# Patient Record
Sex: Female | Born: 1975 | Race: White | Hispanic: No | Marital: Married | Smoking: Never smoker
Health system: Southern US, Academic
[De-identification: ages and names within clinical notes are randomized; demographics above are authoritative.]

## PROBLEM LIST (undated history)

## (undated) DIAGNOSIS — Z9889 Other specified postprocedural states: Secondary | ICD-10-CM

## (undated) DIAGNOSIS — E079 Disorder of thyroid, unspecified: Secondary | ICD-10-CM

## (undated) DIAGNOSIS — E039 Hypothyroidism, unspecified: Secondary | ICD-10-CM

## (undated) DIAGNOSIS — R112 Nausea with vomiting, unspecified: Secondary | ICD-10-CM

## (undated) DIAGNOSIS — Z973 Presence of spectacles and contact lenses: Secondary | ICD-10-CM

## (undated) DIAGNOSIS — K1370 Unspecified lesions of oral mucosa: Secondary | ICD-10-CM

## (undated) HISTORY — PX: EYE SURGERY: SHX253

---
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
## 1998-11-21 HISTORY — PX: HX OTHER: 2100001105

## 1998-11-21 HISTORY — PX: HX TUBAL LIGATION: SHX77

## 2018-02-19 HISTORY — PX: MOUTH SURGERY: SHX715

## 2018-09-24 NOTE — H&P (Signed)
[facility]  Oral and Maxillofacial Surgery  ADMISSION H&P    Lindsey Bass, Lindsey Bass 42 y.o. female  Date of Admission:  (Not on file)  Date of Birth:  [birth year]    CHIEF COMPLAINT:  Hard Palate lesion     HISTORY OF PRESENT ILLNESS   Lindsey Bass is a 42 y.o. female with past medical history of Hypothyroidism who presents to [place] due to lesion on the hard palate. The patient states she was seen originally by Dr. 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 Dr. Grandville Silos was then sent up to [facility] for definitive treatment. The patient stated the area originally was two small lesions that became one area. Denies CP, SOB, F/C/N/V.     PAST MEDICAL HISTORY  No past medical history on file.  Past Medical History was reviewed       MEDICATIONS   Cannot display prior to admission medications because the patient has not been admitted in this contact.          ALLERGIES  Allergies not on file    SURGICAL HISTORY  Past Surgical History was reviewed         FAMILY HISTORY  Family Medical History:     None              SOCIAL HISTORY  Social History     Socioeconomic History   . Marital status: Not on file     Spouse name: Not on file   . Number of children: Not on file   . Years of education: Not on file   . Highest education level: Not on file   Occupational History   . Not on file   Social Needs   . Financial resource strain: Not on file   . Food insecurity:     Worry: Not on file     Inability: Not on file   . Transportation needs:     Medical: Not on file     Non-medical: Not on file   Tobacco Use   . Smoking status: Not on file   Substance and Sexual Activity   . Alcohol use: Not on file   . Drug use: Not on file   . Sexual activity: Not on file   Lifestyle   . Physical activity:     Days per week: Not on file     Minutes per session: Not on file   . Stress: Not on file   Relationships   . Social connections:     Talks on phone: Not on file     Gets  together: Not on file     Attends religious service: Not on file     Active member of club or organization: Not on file     Attends meetings of clubs or organizations: Not on file     Relationship status: Not on file   . Intimate partner violence:     Fear of current or ex partner: Not on file     Emotionally abused: Not on file     Physically abused: Not on file     Forced sexual activity: Not on file   Other Topics Concern   . Not on file   Social History Narrative   . Not on file       REVIEW OF SYSTEMS  Reviewed/ Summarized records and/or obtained History from patient  Other than ROS in the HPI, all  other systems were negative.    PHYSICAL EXAM     Consitutional: Pt NAD. Pleasant.  ENT: PERRL, EOMI.Marland KitchenNo malocclusion, FOM soft, Tongue NROM, CN V, VII intact, No skeletal mobility, Midline uvula, No swelling, Dentition stable, small area intraorally seemed to be scarred over  Cardio: RRR. No murmurs/gallops/rubs.   Resp: CTA. No wheezes/rales/rhonchi. Normal resp effort.  Abd: BS active. Abd soft/NTTP.  GU: Deferred  MSK: No gait disturbances/weakness.   Skin: No rashes/ulcers.  Neuro: a/o x3. No focal deficits. CN 2-12 Grossly intact  Psych: Normal mood/affect    LABS  No results found for this or any previous visit (from the past 24 hour(s)).    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 I saw and examined the patient and agree with the resident's findings and plan as written except as noted     Parke Simmers, DDS

## 2018-10-02 ENCOUNTER — Encounter (HOSPITAL_COMMUNITY): Payer: Self-pay

## 2018-10-09 ENCOUNTER — Ambulatory Visit
Admission: RE | Admit: 2018-10-09 | Discharge: 2018-10-09 | Disposition: A | Discharge status: Home / Self Care | Payer: 59 | Source: Ambulatory Visit

## 2018-10-09 ENCOUNTER — Encounter (HOSPITAL_COMMUNITY): Payer: Self-pay

## 2018-10-09 HISTORY — DX: Other specified postprocedural states: Z98.890

## 2018-10-09 HISTORY — DX: Disorder of thyroid, unspecified: E07.9

## 2018-10-09 HISTORY — DX: Hypothyroidism, unspecified: E03.9

## 2018-10-09 HISTORY — DX: Nausea with vomiting, unspecified: R11.2

## 2018-10-09 HISTORY — DX: Presence of spectacles and contact lenses: Z97.3

## 2018-10-09 HISTORY — DX: Unspecified lesions of oral mucosa: K13.70

## 2018-10-09 LAB — POC BLOOD GLUCOSE (RESULTS): GLUCOSE, POC: 92 mg/dL (ref 70–105)

## 2018-10-10 ENCOUNTER — Inpatient Hospital Stay
Admission: RE | Admit: 2018-10-10 | Discharge: 2018-10-10 | Disposition: A | Discharge status: Home / Self Care | Payer: 59 | Source: Ambulatory Visit | Attending: Oral and Maxillofacial Surgery | Admitting: Oral and Maxillofacial Surgery

## 2018-10-10 ENCOUNTER — Encounter (HOSPITAL_COMMUNITY): Payer: Self-pay

## 2018-10-10 ENCOUNTER — Other Ambulatory Visit (HOSPITAL_COMMUNITY): Payer: Self-pay | Admitting: Oral and Maxillofacial Surgery

## 2018-10-10 ENCOUNTER — Ambulatory Visit (HOSPITAL_COMMUNITY): Payer: 59 | Admitting: Student in an Organized Health Care Education/Training Program

## 2018-10-10 ENCOUNTER — Ambulatory Visit (HOSPITAL_COMMUNITY): Payer: 59 | Admitting: Oral and Maxillofacial Surgery

## 2018-10-10 ENCOUNTER — Encounter (HOSPITAL_COMMUNITY)
Admission: RE | Disposition: A | Discharge status: Home / Self Care | Payer: Self-pay | Source: Ambulatory Visit | Attending: Oral and Maxillofacial Surgery

## 2018-10-10 ENCOUNTER — Ambulatory Visit (HOSPITAL_BASED_OUTPATIENT_CLINIC_OR_DEPARTMENT_OTHER): Payer: 59 | Admitting: Student in an Organized Health Care Education/Training Program

## 2018-10-10 DIAGNOSIS — E039 Hypothyroidism, unspecified: Secondary | ICD-10-CM | POA: Insufficient documentation

## 2018-10-10 DIAGNOSIS — C050 Malignant neoplasm of hard palate: Secondary | ICD-10-CM

## 2018-10-10 DIAGNOSIS — Z79890 Hormone replacement therapy (postmenopausal): Secondary | ICD-10-CM | POA: Insufficient documentation

## 2018-10-10 DIAGNOSIS — D1030 Benign neoplasm of unspecified part of mouth: Secondary | ICD-10-CM

## 2018-10-10 SURGERY — EXCISION LESION ORAL
Anesthesia: General | Site: Mouth | Laterality: Bilateral | Wound class: Clean Contaminated Wounds-The respiratory, GI, Genital, or urinary

## 2018-10-10 MED ORDER — SODIUM CHLORIDE 0.9 % IRRIGATION SOLUTION
1000.0000 mL | Status: DC | PRN
Start: 2018-10-10 — End: 2018-10-10
  Administered 2018-10-10: 13:00:00 1000 mL

## 2018-10-10 MED ORDER — BUPIVACAINE-EPINEPHRINE (PF) 0.5 %-1:200,000 INJECTION SOLUTION
30.0000 mL | Freq: Once | INTRAMUSCULAR | Status: DC | PRN
Start: 2018-10-10 — End: 2018-10-10
  Administered 2018-10-10: 1 mL via INTRAMUSCULAR

## 2018-10-10 MED ORDER — KETOROLAC 30 MG/ML (1 ML) INJECTION SOLUTION
Freq: Once | INTRAMUSCULAR | Status: DC | PRN
Start: 2018-10-10 — End: 2018-10-10
  Administered 2018-10-10 (×2): 30 mg via INTRAVENOUS

## 2018-10-10 MED ORDER — HYDROCODONE 5 MG-ACETAMINOPHEN 325 MG TABLET
1.00 | ORAL_TABLET | ORAL | 0 refills | Status: DC | PRN
Start: 2018-10-10 — End: 2021-06-29

## 2018-10-10 MED ORDER — IBUPROFEN 600 MG TABLET: 600 mg | Tab | Freq: Four times a day (QID) | ORAL | 0 refills | 0 days | Status: AC | PRN

## 2018-10-10 MED ORDER — LIDOCAINE 1 %-EPINEPHRINE 1:100,000 INJECTION SOLUTION
15.0000 mL | Freq: Once | INTRAMUSCULAR | Status: DC | PRN
Start: 2018-10-10 — End: 2018-10-10
  Administered 2018-10-10: 3 mL via INTRAMUSCULAR

## 2018-10-10 MED ORDER — PROPOFOL 10 MG/ML IV BOLUS
INJECTION | Freq: Once | INTRAVENOUS | Status: DC | PRN
Start: 2018-10-10 — End: 2018-10-10
  Administered 2018-10-10: 140 mg via INTRAVENOUS

## 2018-10-10 MED ORDER — GLYCOPYRROLATE 0.2 MG/ML INJECTION SOLUTION
Freq: Once | INTRAMUSCULAR | Status: DC | PRN
Start: 2018-10-10 — End: 2018-10-10
  Administered 2018-10-10: 0.6 mg via INTRAVENOUS

## 2018-10-10 MED ORDER — SCOPOLAMINE 1 MG OVER 3 DAYS TRANSDERMAL PATCH
MEDICATED_PATCH | Freq: Once | TRANSDERMAL | Status: DC | PRN
Start: 2018-10-10 — End: 2018-10-10
  Administered 2018-10-10: 1 via TRANSDERMAL

## 2018-10-10 MED ORDER — SODIUM CHLORIDE 0.9 % (FLUSH) INJECTION SYRINGE
2.0000 mL | INJECTION | INTRAMUSCULAR | Status: DC | PRN
Start: 2018-10-10 — End: 2018-10-10

## 2018-10-10 MED ORDER — DEXMEDETOMIDINE 4 MCG/ML IV DILUTION
Freq: Once | INTRAMUSCULAR | Status: DC | PRN
Start: 2018-10-10 — End: 2018-10-10
  Administered 2018-10-10: 13:00:00 4 ug via INTRAVENOUS
  Administered 2018-10-10 (×3): 8 ug via INTRAVENOUS

## 2018-10-10 MED ORDER — APREPITANT 40 MG CAPSULE
ORAL_CAPSULE | Freq: Once | ORAL | Status: DC | PRN
Start: 2018-10-10 — End: 2018-10-10
  Administered 2018-10-10: 40 mg via ORAL

## 2018-10-10 MED ORDER — SODIUM CHLORIDE 0.9 % (FLUSH) INJECTION SYRINGE
2.0000 mL | INJECTION | Freq: Three times a day (TID) | INTRAMUSCULAR | Status: DC
Start: 2018-10-10 — End: 2018-10-10

## 2018-10-10 MED ORDER — ONDANSETRON HCL (PF) 4 MG/2 ML INJECTION SOLUTION
Freq: Once | INTRAMUSCULAR | Status: DC | PRN
Start: 2018-10-10 — End: 2018-10-10
  Administered 2018-10-10: 4 mg via INTRAVENOUS

## 2018-10-10 MED ORDER — DEXAMETHASONE SODIUM PHOSPHATE 4 MG/ML INJECTION SOLUTION
Freq: Once | INTRAMUSCULAR | Status: DC | PRN
Start: 2018-10-10 — End: 2018-10-10
  Administered 2018-10-10: 4 mg via INTRAVENOUS

## 2018-10-10 MED ORDER — MIDAZOLAM 1 MG/ML INJECTION SOLUTION
Freq: Once | INTRAMUSCULAR | Status: DC | PRN
Start: 2018-10-10 — End: 2018-10-10
  Administered 2018-10-10: 2 mg via INTRAVENOUS

## 2018-10-10 MED ORDER — NEOSTIGMINE METHYLSULFATE 5 MG/5 ML (1 MG/ML) INTRAVENOUS SYRINGE
INJECTION | Freq: Once | INTRAVENOUS | Status: DC | PRN
Start: 2018-10-10 — End: 2018-10-10
  Administered 2018-10-10: 4 mg via INTRAVENOUS

## 2018-10-10 MED ORDER — CHLORHEXIDINE GLUCONATE 0.12 % MOUTHWASH
15.00 mL | MOUTHWASH | Freq: Two times a day (BID) | 0 refills | Status: DC
Start: 2018-10-10 — End: 2021-06-29

## 2018-10-10 MED ORDER — KETAMINE 10 MG/ML INJECTION WRAPPER
Freq: Once | INTRAMUSCULAR | Status: DC | PRN
Start: 2018-10-10 — End: 2018-10-10
  Administered 2018-10-10: 30 mg via INTRAVENOUS

## 2018-10-10 MED ORDER — EPHEDRINE SULFATE 50 MG/ML INJECTION SOLUTION
Freq: Once | INTRAMUSCULAR | Status: DC | PRN
Start: 2018-10-10 — End: 2018-10-10
  Administered 2018-10-10 (×2): 5 mg via INTRAVENOUS

## 2018-10-10 MED ORDER — LIDOCAINE (PF) 100 MG/5 ML (2 %) INTRAVENOUS SYRINGE
INJECTION | Freq: Once | INTRAVENOUS | Status: DC | PRN
Start: 2018-10-10 — End: 2018-10-10
  Administered 2018-10-10: 80 mg via INTRAVENOUS

## 2018-10-10 MED ORDER — BUPIVACAINE-EPINEPHRINE (PF) 0.5 %-1:200,000 INJECTION SOLUTION
INTRAMUSCULAR | Status: AC
Start: 2018-10-10 — End: 2018-10-10
  Filled 2018-10-10: qty 30

## 2018-10-10 MED ORDER — CEFAZOLIN 2 GRAM SOLUTION FOR INJECTION - IV PUSH KIT
2.0000 g | Freq: Once | INTRAMUSCULAR | Status: AC
Start: 2018-10-10 — End: 2018-10-10
  Administered 2018-10-10: 2 g via INTRAVENOUS
  Filled 2018-10-10: qty 20

## 2018-10-10 MED ORDER — LIDOCAINE 1 %-EPINEPHRINE 1:100,000 INJECTION SOLUTION
INTRAMUSCULAR | Status: AC
Start: 2018-10-10 — End: 2018-10-10
  Filled 2018-10-10: qty 30

## 2018-10-10 MED ORDER — FENTANYL (PF) 50 MCG/ML INJECTION SOLUTION
12.5000 ug | INTRAMUSCULAR | Status: DC | PRN
Start: 2018-10-10 — End: 2018-10-10

## 2018-10-10 MED ORDER — FENTANYL (PF) 50 MCG/ML INJECTION SOLUTION
25.0000 ug | INTRAMUSCULAR | Status: DC | PRN
Start: 2018-10-10 — End: 2018-10-10
  Administered 2018-10-10: 25 ug via INTRAVENOUS
  Filled 2018-10-10: qty 2

## 2018-10-10 MED ORDER — ACETAMINOPHEN 1,000 MG/100 ML (10 MG/ML) INTRAVENOUS SOLUTION
Freq: Once | INTRAVENOUS | Status: DC | PRN
Start: 2018-10-10 — End: 2018-10-10
  Administered 2018-10-10: 1000 mg via INTRAVENOUS

## 2018-10-10 MED ORDER — LACTATED RINGERS INTRAVENOUS SOLUTION
INTRAVENOUS | Status: DC
Start: 2018-10-10 — End: 2018-10-10
  Administered 2018-10-10: 16:00:00 0 via INTRAVENOUS

## 2018-10-10 MED ORDER — ROCURONIUM 10 MG/ML INTRAVENOUS SOLUTION
Freq: Once | INTRAVENOUS | Status: DC | PRN
Start: 2018-10-10 — End: 2018-10-10
  Administered 2018-10-10: 50 mg via INTRAVENOUS

## 2018-10-10 MED ORDER — CHLORHEXIDINE GLUCONATE 0.12 % MOUTHWASH
15.0000 mL | MOUTHWASH | Freq: Once | Status: AC
Start: 2018-10-10 — End: 2018-10-10
  Administered 2018-10-10: 15 mL via TOPICAL

## 2018-10-10 MED ORDER — LACTATED RINGERS INTRAVENOUS SOLUTION
INTRAVENOUS | Status: DC
Start: 2018-10-10 — End: 2018-10-10

## 2018-10-10 MED ADMIN — lidocaine (PF) 100 mg/5 mL (2 %) intravenous syringe: INTRAVENOUS | @ 13:00:00

## 2018-10-10 MED ADMIN — lactated Ringers intravenous solution: INTRAVENOUS | @ 16:00:00

## 2018-10-10 MED ADMIN — glycopyrrolate 0.2 mg/mL injection solution: INTRAVENOUS | @ 14:00:00

## 2018-10-10 MED ADMIN — aprepitant 40 mg capsule: ORAL | @ 13:00:00

## 2018-10-10 MED ADMIN — sodium chloride 0.9 % intravenous solution: INTRAVENOUS | @ 14:00:00 | NDC 00338004904

## 2018-10-10 MED ADMIN — lactated Ringers intravenous solution: INTRAVENOUS | @ 13:00:00 | NDC 00338011704

## 2018-10-10 SURGICAL SUPPLY — 32 items
ADHESIVE TISSUE EXOFIN 1.0ML_PREMIERPRO EXOFIN (SEALANTS)
APPL FBRTP 6IN STRL LTX COTTON WD QTP ABS TIP NONLINT PRECNT (WOUND CARE SUPPLY) IMPLANT
APPLICATOR COT TIP ST 6IN 12PK_13016WO 12EA/PK 48PK/CS (WOUND CARE/ENTEROSTOMAL SUPPLY)
BLANKET MISTRAL-AIR ADULT LWR BODY 55.9X40.2IN FRC AIR HI VOL BLWR INTUITIVE CONTROL PNL LRG LED (MISCELLANEOUS PT CARE ITEMS) ×1 IMPLANT
BLANKET WARMER 55.9INW X 40.2I_LOWER BODY MISTRAL-AIR (MISCELLANEOUS PT CARE ITEMS) ×1
CONV USE 23866 - NEEDLE HYPO 27GA 1.5IN STD MONOJECT SS POLYPROP REG BVL LL HUB UL SHRP ANTICORE YW STRL LF  DISP (NEEDLES & SYRINGE SUPPLIES) ×1 IMPLANT
CONV USE ITEM 156524 - ADHESIVE TISSUE EXOFIN 1.0ML_PREMIERPRO EXOFIN (SEALANTS) IMPLANT
CONV USE ITEM 161880 - TOOTHBRUSH NYLON WHT_TB50 144EA/BX (TOLT) ×2 IMPLANT
CONV USE ITEM 337890 - PACK SURG BSIN 2 STRL LF  DISP (CUSTOM TRAYS & PACK) ×1 IMPLANT
CORD BIPOLAR 12IN SILVERGLIDE STR CABLE STRL LF  DISP (CAUTERY SUPPLIES) IMPLANT
CORD BIPOLAR 12IN SILVERGLIDE_CABLE STRL LF DISP (CAUTERY SUPPLIES)
COVER WAND RFD STRL 50EA/CS_01-0020 (EQUIPMENT MINOR)
COVER WND RF DETECT STRL CLR EQP (EQUIPMENT MINOR) IMPLANT
DISCONTINUED USE 338665 - PACK SURG ENT STRL DISP LTX (PROTECTIVE PRODUCTS/GARMENTS) ×1 IMPLANT
ELECTRODE ESURG 360D BLADE PNC_L STD 10FT 3/32IN VLAB STRL (CAUTERY SUPPLIES)
ELECTRODE ESURG 360D PNCL STD 10FT 3/32IN VLAB STRL DISP SMOKE EVAC ATTACH CORD HLSTR (CAUTERY SUPPLIES) IMPLANT
ELECTRODE ESURG BLADE 2.75IN 3/32IN EDGE STRL .2IN DISP INSL STD SHAFT HEX LOCK LF (CAUTERY SUPPLIES) ×1 IMPLANT
ELECTRODE ESURG BLADE STD 2.75_IN 3/32IN STRL EDGE .2IN DISP (CAUTERY SUPPLIES) ×2
ELECTRODE ESURG NEEDLE 4CM 3/32IN COLORADO STRL TUNG DISP INSL STR SLEEVE MCDSCT (NEEDLES & SYRINGE SUPPLIES) ×1 IMPLANT
ELECTRODE ESURG NEEDLE 4CM 3/3_2IN COLORADO STRL TUNG DISP (NEEDLES & SYRINGE SUPPLIES) ×1
GOWN SURG XL AAMI L3 NONREINFO_RCE HKLP CLSR STRL LTX PNK SMS (DGOW)
GOWN SURG XL L3 NONREINFORCE HKLP CLSR STRL LTX PNK SMS 47IN (DGOW) IMPLANT
NEEDLE HYPO  27GA 1.5IN STD MONOJECT SS POLYPROP REG BVL LL (NEEDLES & SYRINGE SUPPLIES) ×1
PACK BASIN DBL CUSTOM (CUSTOM TRAYS & PACK) ×1
PACK CUSTOM ENT_1/CS (PROTECTIVE PRODUCTS/GARMENTS) ×1
PACK SURG ENT STRL DISP LTX (PROTECTIVE PRODUCTS/GARMENTS) ×1
PACK SURG TBG STRL DISP 30IN SIL LF (Connecting Tubes/Misc) ×1 IMPLANT
PAD RELEASE 3 X 4 IN_1050 50/BX (WOUND CARE SUPPLY) ×1 IMPLANT
PAD RELEASE 3 X 4 IN_1050 50/BX (WOUND CARE/ENTEROSTOMAL SUPPLY) ×1
TRAY SKIN SCRUB 8IN VNYL COTTON 6 WNG 6 SPONGE STICK 2 TIP APPL DRY STRL LF (KITS & TRAYS (DISPOSABLE)) IMPLANT
TRAY SURG PREP SCR CR ESTM (KITS & TRAYS (DISPOSABLE))
TUBING SILICONE 30IN (Connecting Tubes/Misc) ×1

## 2018-10-10 NOTE — Nurses Notes (Signed)
Pt. Discharged to home in stable condition with husband via wheelchair by PCA.  All belongings accounted for.

## 2018-10-10 NOTE — Anesthesia Transfer of Care (Signed)
ANESTHESIA TRANSFER OF CARE   Lindsey Bass is a 42 y.o. ,female, Weight: 78.4 kg (172 lb 13.5 oz)   had Procedure(s) with comments:  EXCISION LESION ORAL - HARD PALATE  performed  10/10/18   Primary Service: Parke Simmers, DDS    Past Medical History:   Diagnosis Date   . Hypothyroidism    . Oral lesion    . PONV (postoperative nausea and vomiting)    . Thyroid disorder    . Wears glasses       Allergy History as of 10/10/18      No Known Allergies              I completed my transfer of care / handoff to the receiving personnel during which we discussed:  Access, Airway, All key/critical aspects of case discussed, Analgesia, Antibiotics, Expectation of post procedure, Fluids/Product, Gave opportunity for questions and acknowledgement of understanding and PMHx    Post Location: PACU                                          Additional Info:Pt transferred to PACU in stable condition. Report given to the nurse                      Last OR Temp: Temperature: 36.5 C (97.7 F)  ABG:   Airway:* No LDAs found *  Blood pressure 127/72, pulse 88, temperature 36.5 C (97.7 F), resp. rate 14, height 1.727 m (5\' 8" ), weight 78.4 kg (172 lb 13.5 oz), last menstrual period 09/27/2018, SpO2 99 %, not currently breastfeeding.

## 2018-10-10 NOTE — Brief Op Note (Signed)
[facility]                                                     BRIEF OPERATIVE NOTE    Patient Name: Kemani, Heidel Number: [id number]  Date of Service: 10/10/2018   Date of Birth: [birth year]    All elements must be documented.    Pre-Operative Diagnosis: Pleomorphic adenoma, lesion of mouth, soft tissue  Post-Operative Diagnosis: Same  Procedure(s)/Description:  Excisional biopsy, placement palatal splint  Findings/Complexity (inherent to the procedure performed): frozens, right deep margin closely approximateing within 23mm     Attending Surgeon: Sudie Bailey  Assistant(s): Dario Ave    Anesthesia Type: General  Estimated Blood Loss:  Minimal  Blood Given: N/A  Fluids Given: 789FY  Complications (not routinely expected or not inherent to difficulty/nature of procedure):N/A  Characteristic Event (routinely expected or inherent to the difficulty/nature of the procedure): N/A  Did the use of current and/or prior Anticoagulants impact the outcome of the case? no  Wound Class: Clean Wound: Uninfected operative wounds in which no inflammation occurred    Tubes: None  Drains: None  Specimens/ Cultures: palate peomorphic adenoma  Implants: N/A           Disposition: PACU - hemodynamically stable.  Condition: stable    Josetta Huddle, DMD

## 2018-10-10 NOTE — OR Surgeon (Signed)
[facility]                                                     OPERATIVE NOTE    Patient Name: Lindsey Bass: [id number]  Date of Service: 10/10/2018  Date of Birth: [birth year]    All elements must be documented.    Pre-Operative Diagnosis: pleomorphic adenoma of hard palate  Post-Operative Diagnosis: Same  Operation performed:  Excision of palatal pleomorphic adenoma    Attending Surgeon: Sudie Bailey, DDS, MD  Assistant(s): Dorothyann Gibbs, DDS; Josetta Huddle, DMD; Burnis Medin, DDS    Anesthesia Type: General nasal endotracheal anesthesia    ESTIMATED BLOOD LOSS: minimal    COMPLICATIONS:  None.    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 Per documentation, the lesion was excisional biopsied as a pleomorphic adenoma and there were concerns for positive margins. [facility] was consulted for evaluation and management of palatal lesion. Patient was consented for excision of lesion and application of stent. All questions answered prior to procedure.     OPERATIVE FINDINGS: 2x2 cm of palatal tissue was excised incorporating 1 cm margins peripherally around a likely 1x1cm lesion. Per pathologist, right deep margin with approximation of lesion however grossly clean. No erosion of palatal bone was visualized.    DESCRIPTION OF PROCEDURE:  Patient was met in the preop area and informed consent was reviewed.    The patient was brought to the operating room and placed in the supine position. Time-out completed. Patient was intubated with nasoendotracheal tube, secured with head wrap and tape. Eyes protected with clear tape, following induction of general anesthesia intraoral prepped with 0.12% chlorohexidine and toothbrush.   The oropharynx was suctioned freed of all secretions and debris. Patient draped in a sterile fashion. A throat pack was placed.      1% lidocaine  w/1:100:000 epinehprine used via infiltrations    Molt mouth inserted. A marking pen was used to design a 2x2cm circle around a visible scar in the hard palate along midline. A bovie cautery was used to make an incision down to palatal bone. The circle of tissue was removed in a full thickness fashion. The palatal bone remained exposed without residual periosteum. Peripheral tissue was cauterized to allow for hemostasis. A splint which had been fabricated was inserted with denture reline material to allow for coverage over the palatal defect. Frozen margins were negative, there was no violation of the periosteum and no visible bony involvement. Reline material was trimmed as appropriate, the splint was observed to seat without significant displacement.   0.5% marcaine was injected into the palatal tissues prior to extubation.     Sites noted to be hemostatic. Copious irrigation with normal saline and suctioned. Throat pack removed with simultaneous suctioning with Yankauer. Orogastric tube inserted and suctioning of gastric contents was performed.    Patient tolerated the procedure well, was extubated in the operating room and was brought to the recovery room in stable condition. The attending Dr. Sudie Bailey was present for the entirety of the procedure      Dorothyann Gibbs, DDS  10/10/2018, 14:13    I was present for all key and/or critical portions of the case and immediately available at  all times.      Parke Simmers, DDS 10/16/2018, 08:33

## 2018-10-10 NOTE — Discharge Summary (Signed)
[facility]  DISCHARGE SUMMARY    PATIENT NAME:  Lindsey Bass, Lindsey Bass  MRN:  [id number]  DOB:  [birth year]    ENCOUNTER DATE:  10/10/2018  INPATIENT ADMISSION DATE:   DISCHARGE DATE:  10/10/2018    ATTENDING PHYSICIAN: Parke Simmers, DDS  SERVICE: ORAL SURGERY  PRIMARY CARE PHYSICIAN: No Pcp         LAY CAREGIVER:  ,  ,        PRIMARY DISCHARGE DIAGNOSIS:   There are no hospital problems to display for this patient.    There are no active non-hospital problems to display for this patient.       DISCHARGE MEDICATIONS:     Current Discharge Medication List      START taking these medications.      Details   chlorhexidine gluconate 0.12 % Mouthwash  Commonly known as:  PERIDEX   15 mL, Swish & Spit, 2 TIMES DAILY  Qty:  473 mL  Refills:  0     HYDROcodone-acetaminophen 5-325 mg Tablet  Commonly known as:  NORCO   1 Tab, Oral, EVERY 4 HOURS PRN  Qty:  15 Tab  Refills:  0     Ibuprofen 600 mg Tablet  Commonly known as:  MOTRIN   600 mg, Oral, 4 TIMES DAILY PRN  Qty:  50 Tab  Refills:  0        CONTINUE these medications - NO CHANGES were made during your visit.      Details   levothyroxine 112 mcg Tablet  Commonly known as:  SYNTHROID   112 mcg, Oral, EVERY MORNING  Refills:  0          Discharge med list refreshed?  YES                ALLERGIES:  No Known Allergies          HOSPITAL PROCEDURE(S):   Bedside Procedures:  No orders of the defined types were placed in this encounter.    Surgical Procedure(s):  EXCISION LESION ORAL    REASON FOR HOSPITALIZATION AND HOSPITAL COURSE     BRIEF HPI:  This is a 42 y.o., female admitted for biopsy of palate    BRIEF HOSPITAL NARRATIVE: Patient was admitted on 10/10/2018. Patient was taken to the OR on 10/10/2018  for biopsy of palate and placement of palatal splint. Patient had an uneventful preoperative, operative and postoperative course. Patient was transferred to the PACU in stable condition  As the patient was continuing to improve, vital signs remained stable, patient  remained afebrile, tolerating PO, ambulating w/o assistance, surgical wounds healing w/o signs of infection or complications, the patient's overall condition was judged suitable for discharge to home on 10/10/2018.       TRANSITION/POST DISCHARGE CARE/PENDING TESTS/REFERRALS: follow up OMFS in two weeks        CONDITION ON DISCHARGE:  A. Ambulation: Full ambulation  B. Self-care Ability: Complete  C. Cognitive Status Alert and Oriented x 3  D. Code status at discharge:   Code Status Information     Code Status    Not on file                 LINES/DRAINS/WOUNDS AT DISCHARGE:   Patient Lines/Drains/Airways Status    Active Line / Dialysis Catheter / Dialysis Graft / Drain / Airway / Wound     Name: Placement date: Placement time: Site: Days:    Peripheral IV Posterior;Right Dorsal Metacarpals  (top  of hand)  10/10/18   1119   less than 1    Surgical Incision Upper Mouth  10/10/18   1317   less than 1                DISCHARGE DISPOSITION:  Home discharge              DISCHARGE INSTRUCTIONS:       DISCHARGE INSTRUCTION - [place]    Biopsy Discharge Instructions    Bleeding: It is common to have some bleeding 1-2 days after biopsy.  When you rinse your mouth, or spit, there may be some pink in your saliva.  This is perfectly normal and of absolutely no concern.  Most bleeding is controlled by applying direct pressure on the wound.  Biting on gauze for 15-30 minutes should control the bleeding.  If you examine the wound and there is heavy bleeding directly at the site of biopsy, you should contact our office so that we can evaluate your mouth to determine whether or not this is a problem.    Eating: Some foods may be uncomfortable to consume. You may eat and drink anything that will not disturb the biopsy sites.  You should avoid drinking through straws on the day of surgery. You should regulate your diet depending on how you feel.    Oral hygiene:  After every meal you should rinse your mouth out with warm salt water to  keep  sites as sterile as possible to decrease the risk of infection.  You should rinse your mouth twice a day with one capful of Peridex mouthwash if it was prescribed to you.    -Remove splint 48hours(friday 10/12/2018) post op and rinse with peridex mouthrinse prescribe to you. Replace splint and rinse nightly.     Pain: Please take pain medication as directed. After this time you should be able to limit the pain and discomfort with over the counter pain medicine such as Tylenol or Motrin.  Remember never to take pain medication on an empty stomach.      Care plan:   Please call to schedule a follow-up appointment preferably after Thanksgiving 2019 with the [facility] Oral and Maxillofacial Surgery Department. You may contact your surgeon w/questions or concerns.    Address:  Department of Oral and Maxillofacial Surgery  [place], [place] [address]  Phone: [phone]    In the event of an emergency please contact emergency medical help. You may also contact your Oral & Maxillofacial Surgeon during usual business hours at [phone]. An on-call Oral & Maxillofacial Surgeon is available for emergent issues during non-business hours by calling the hospital operator at [phone] and asking for the oral and maxillofacial surgery resident on call to be paged.     SCHEDULE FOLLOW-UP ORAL SURGERY     Follow-up appointment: ORAL SURGERY    Reason for visit: POST-OP VISIT    Follow-up in: 2 WEEKS    Follow-up reason: palatal biopsy                 Josetta Huddle, MD      Copies sent to Care Team       Relationship Specialty Notifications Start End    Pcp, No PCP - General   10/09/18           Referring providers can utilize [email or web address] to access their referred [facility] patient's information.

## 2018-10-10 NOTE — Anesthesia Preprocedure Evaluation (Signed)
ANESTHESIA PRE-OP EVALUATION  Planned Procedure: EXCISION LESION ORAL (Bilateral Mouth)  Review of Systems    PONV       patient summary reviewed          Pulmonary     Cardiovascular  negative cardio ROS,   No peripheral edema,  Exercise Tolerance: <4 METS        GI/Hepatic/Renal   negative GI/hepatic/renal ROS,      Endo/Other          Neuro/Psych/MS        Cancer                     Physical Assessment      Patient summary reviewed   Airway       Mallampati: II    TM distance: >3 FB    Neck ROM: full  Mouth Opening: good.  No Facial hair  No Beard  No endotracheal tube present  No Tracheostomy present    Dental       Dentition intact             Pulmonary    Breath sounds clear to auscultation  (-) no rhonchi, no decreased breath sounds, no wheezes, no rales and no stridor     Cardiovascular    Rhythm: regular  Rate: Normal  (-) no friction rub, carotid bruit is not present, no peripheral edema and no murmur     Other findings            Plan  Planned anesthesia type: general and GETA    plan to administer opioids postoperatively  SLEEP APNEA  positive perioperative obstructive sleep apnea screen  ASA 2     Intravenous induction     Anesthetic plan and risks discussed with patient and spouse.     Anesthesia issues/risks discussed are: Dental Injuries, PONV, Post-op Cognitive Dysfunction, Post-op Agitation/Tantrum and Post-op Pain Management.        Patient's NPO status is appropriate for Anesthesia.           Plan discussed with attending and resident.            Urine Pregnancy Results: Negative

## 2018-10-10 NOTE — Anesthesia Postprocedure Evaluation (Signed)
Anesthesia Post Op Evaluation    Patient: [name]  Procedure(s) with comments:  EXCISION LESION ORAL - HARD PALATE    Last Vitals:Temperature: 36.7 C (98.1 F) (10/10/18 1530)  Heart Rate: 75 (10/10/18 1545)  BP (Non-Invasive): 108/69 (10/10/18 1545)  Respiratory Rate: 20 (10/10/18 1545)  SpO2: 96 % (10/10/18 1545)    Patient is sufficiently recovered from the effects of anesthesia to participate in the evaluation and has returned to their pre-procedure level.  Patient location during evaluation: PACU   Post-procedure handoff checklist completed    Patient participation: complete - patient participated  Level of consciousness: awake and alert and responsive to verbal stimuli  Pain management: adequate  Airway patency: patent  Anesthetic complications: no  Cardiovascular status: acceptable  Respiratory status: acceptable  Hydration status: acceptable  Patient post-procedure temperature: Pt Normothermic   PONV Status: Absent

## 2018-10-12 LAB — HISTORICAL SURGICAL PATHOLOGY SPECIMEN

## 2018-10-16 NOTE — H&P (Signed)
[facility]  H&P Update Form    Lindsey, Bass, 42 y.o. female  Encounter Start Date:  10/10/2018  Inpatient Admission Date: 10/10/2018  Date of Birth:  1976-10-08    11/202019    STOP: IF H&P IS GREATER THAN 30 DAYS FROM SURGICAL DAY COMPLETE NEW H&P IS REQUIRED.     H & P updated the day of the procedure.  1.  H&P completed within 30 days of surgical procedure by Dr. Renard Hamper on 09/24/2018 and has been reviewed within 24 hours of the surgery, the patient has been examined, and no change has occured in the patients condition since the H&P was completed.       Change in medications: No          Last Menstrual Period: Unknown       Comments:     2.  Patient continues to be appropiate candidate for planned surgical procedure. YES      Burnis Medin, DDS    Addendum for 10/10/2018

## 2020-10-14 IMAGING — MG 3D SCREENING MAMMO BIL W/CAD
5 series · 9 of 24 positions shown · non-contrast
Comparison: 04/03/2021

------------- REPORT [id number] -------------
[facility]
[address]
Daina Ms.VIBES, TAMUNOSIKI:
We wish to report the following on your recent mammography examination. We are sending a report to your referring physician or other health care provider. 
(       Normal/Negative:
No evidence of cancer.
This statement is mandated by the [place], Department of Health.
Your examination was performed by one of our technologists, who are registered radiological technologists and also specially certified in mammography:
___
Parlak, Edaly (M)
Nepomuceno, Martinez (M)

Your mammogram was interpreted by our radiologist.
( 
Sofeine Made, M.D.
(Annual Breast Examination by a physician or other health care provider
(Annual Mammography Screening beginning at age 40
(Monthly Breast Self Examination
------------- REPORT [id number] -------------
﻿
                         #:
EXAM:  3D BILATERAL ANNUAL SCREENING DIGITAL MAMMOGRAM WITH CAD AND TOMOSYNTHESIS
INDICATION: Screening.

[R]
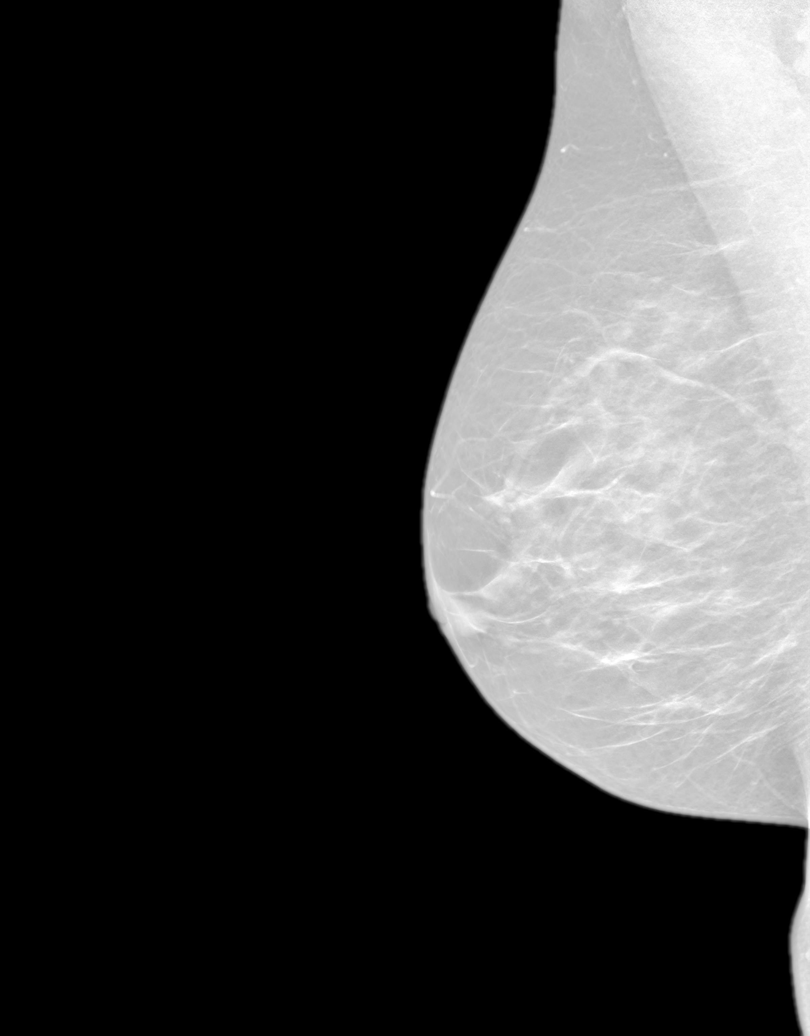

[L]
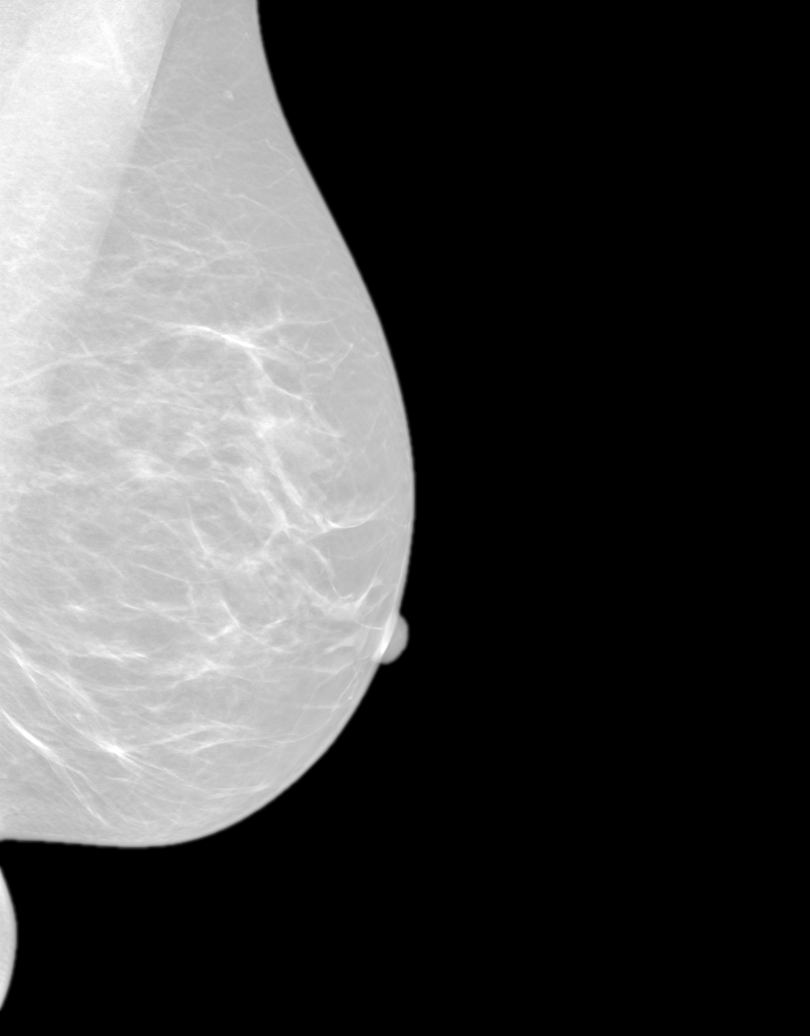

[Series 1383: R CC · right · 2 of 2 slices shown]
[im 1/2]
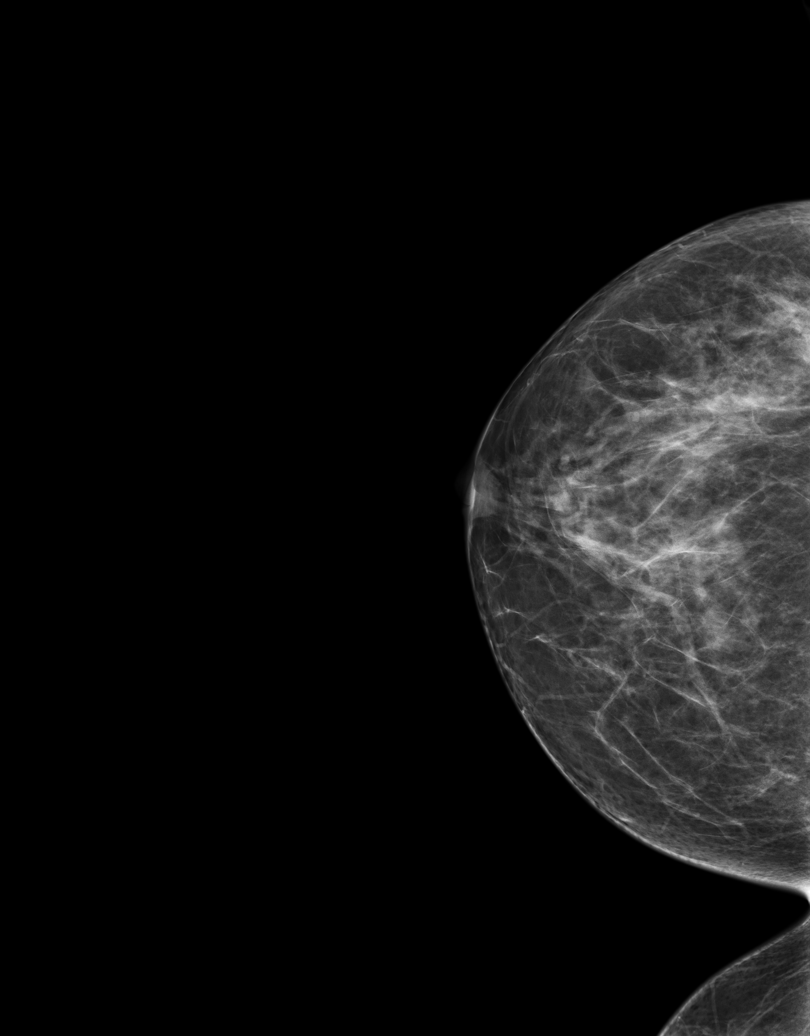
[im 2/2]
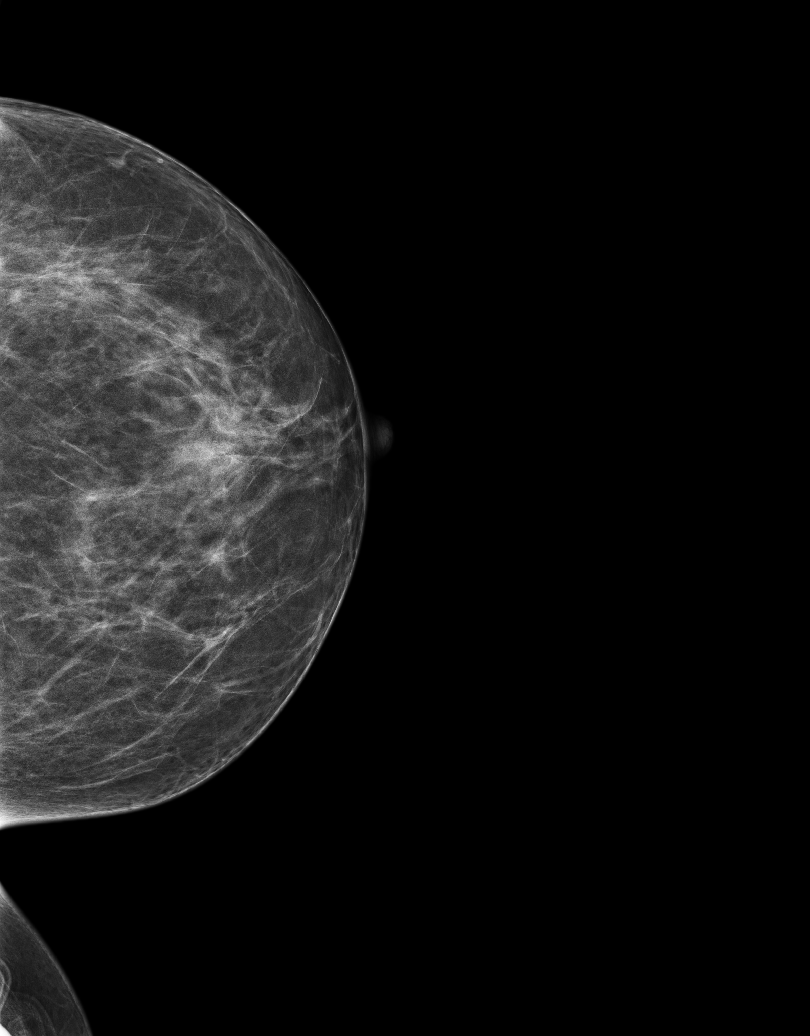

[Series 1385: 3D SCREENING MAMMO BIL W/CAD · 2 acquisitions, 4 frames shown (1 of 2)]
[im 1/2]
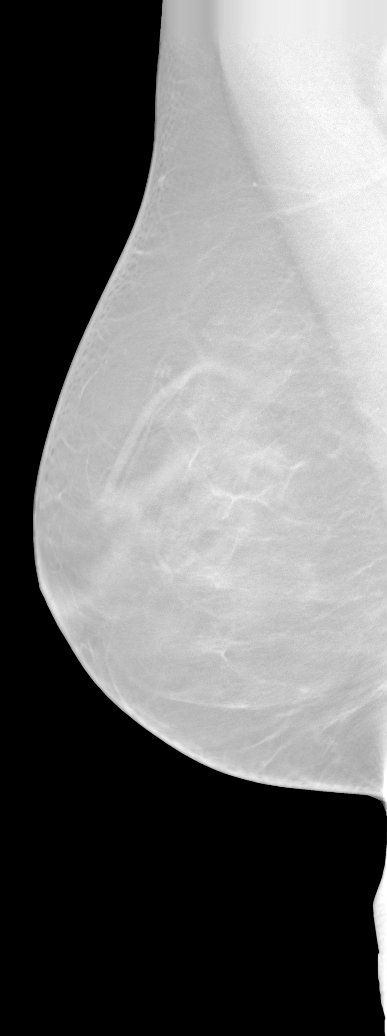
[im 1/2]
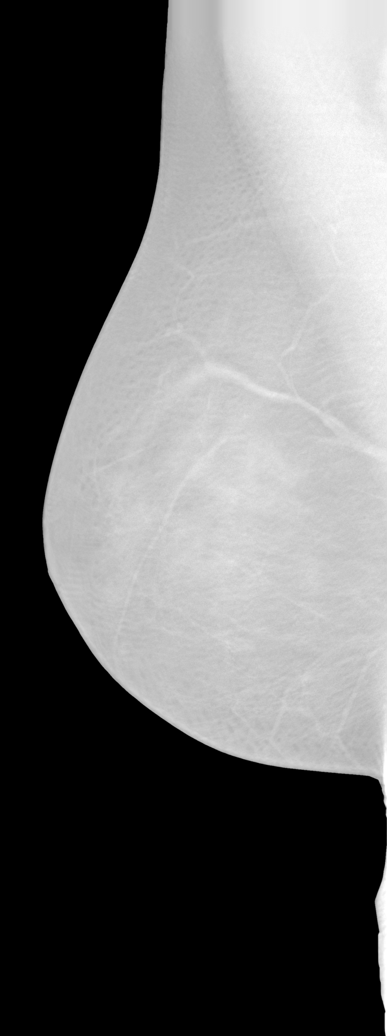
[im 2/2]
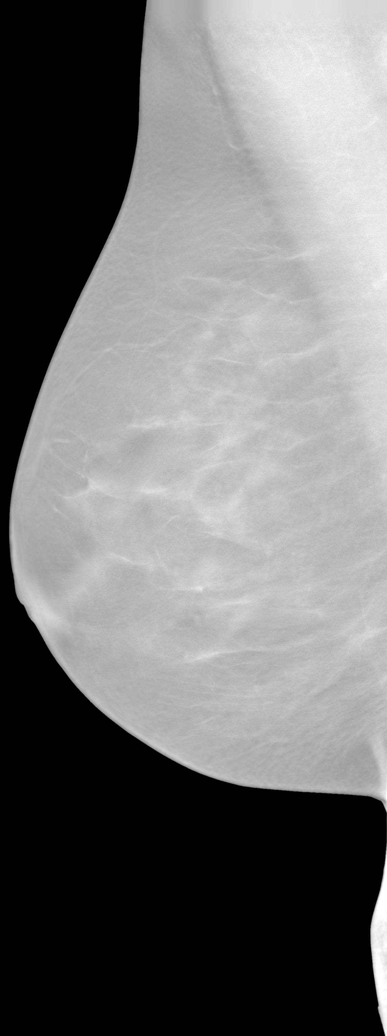
[im 2/2]
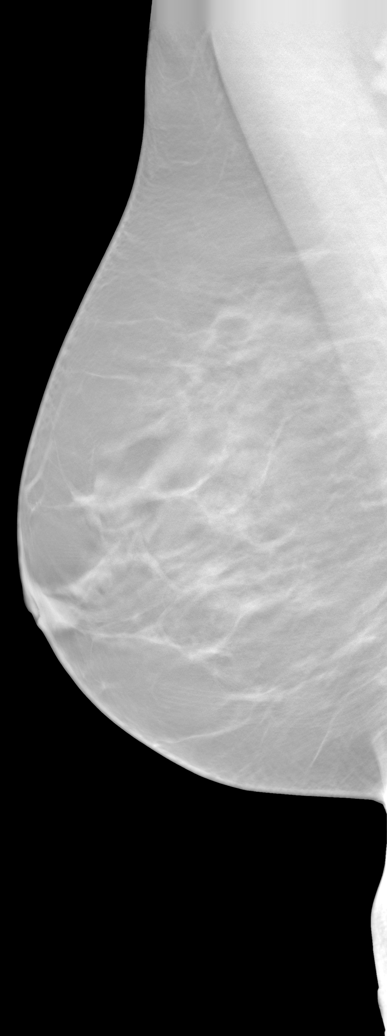

[3D SCREENING MAMMO BIL W/CAD (2 of 2) · tomo slice 13/78.0]
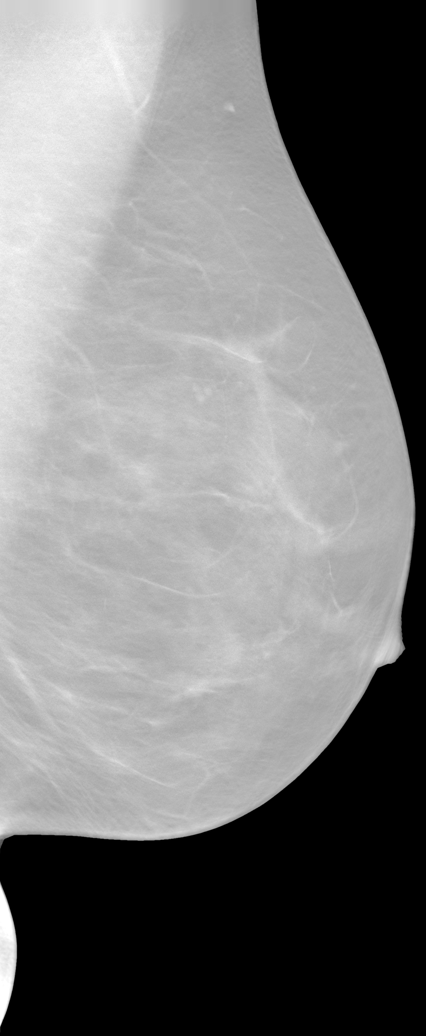

[9 of 24 positions shown; findings below may reference images not displayed]

FINDINGS: Breast parenchyma is heterogeneously dense.  There is no mass or suspicious cluster of microcalcifications.  There is no architectural distortion, skin thickening or nipple retraction.
IMPRESSION: 1.  BIRADS 2-Benign findings. Patient has been added in a reminder system with a target date for the next screening mammography.

2.  DENSITY CODE – C (Heterogeneously dense). 

Final Assessment Code:

Bi-Rads 2 

BI-RADS 0
Need additional imaging evaluation.

BI-RADS 1
Negative mammogram.

BI-RADS 2
Benign finding.

BI-RADS 3
Probably benign finding; short-interval follow-up suggested.

BI-RADS 4
Suspicious abnormality; biopsy should be considered.

BI-RADS 5
Highly suggestive of malignancy; appropriate action should be taken.

BI-RADS 6
Known biopsy-proven malignancy; appropriate action should be taken.

NOTE:
In compliance with Federal regulations, the results of this mammogram are being sent to the patient.

## 2021-01-12 IMAGING — CR XRAY LUMBAR SPINE MINIMUM 4 VIEWS
1 series · 5 of 5 positions shown · non-contrast
Comparison: None available.

﻿EXAM:  77002 - X-RAY EXAM L-S SPINE MINIMUM 4 VIEWS
INDICATION: Bilateral lower extremity tingling.

[Series 1: view not recorded · 0.17mm/px · 5 of 5 slices shown]
[im 1/5]
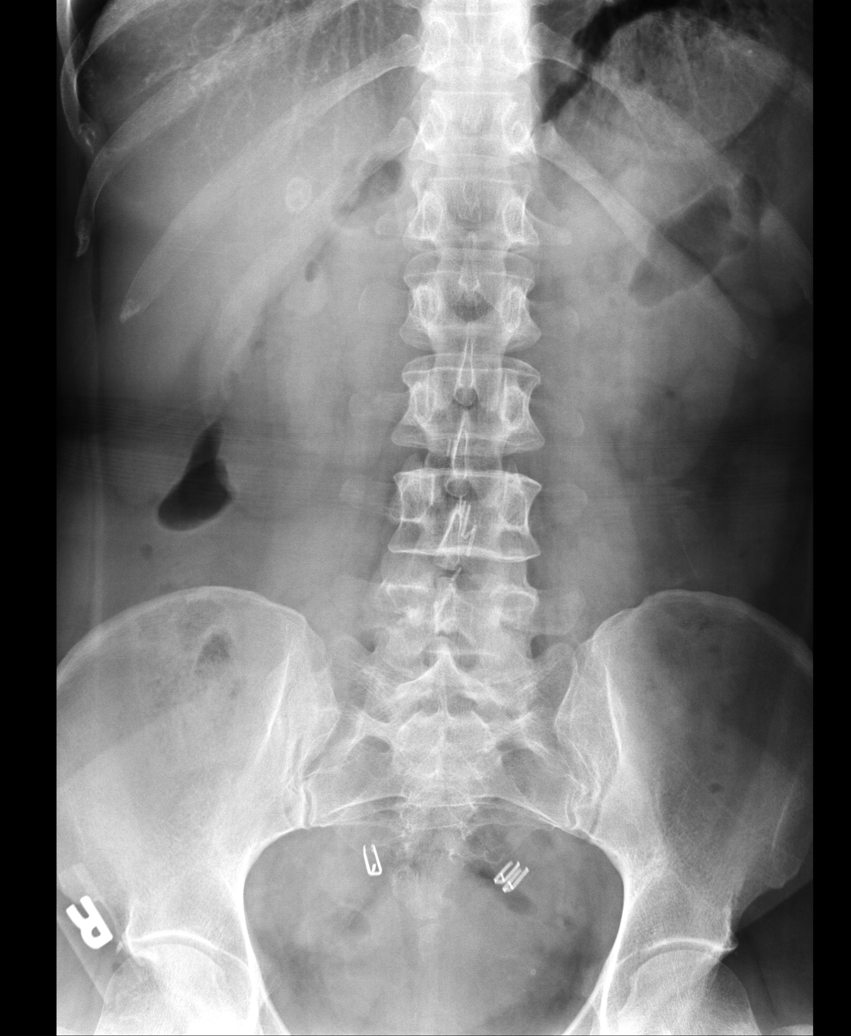
[im 2/5]
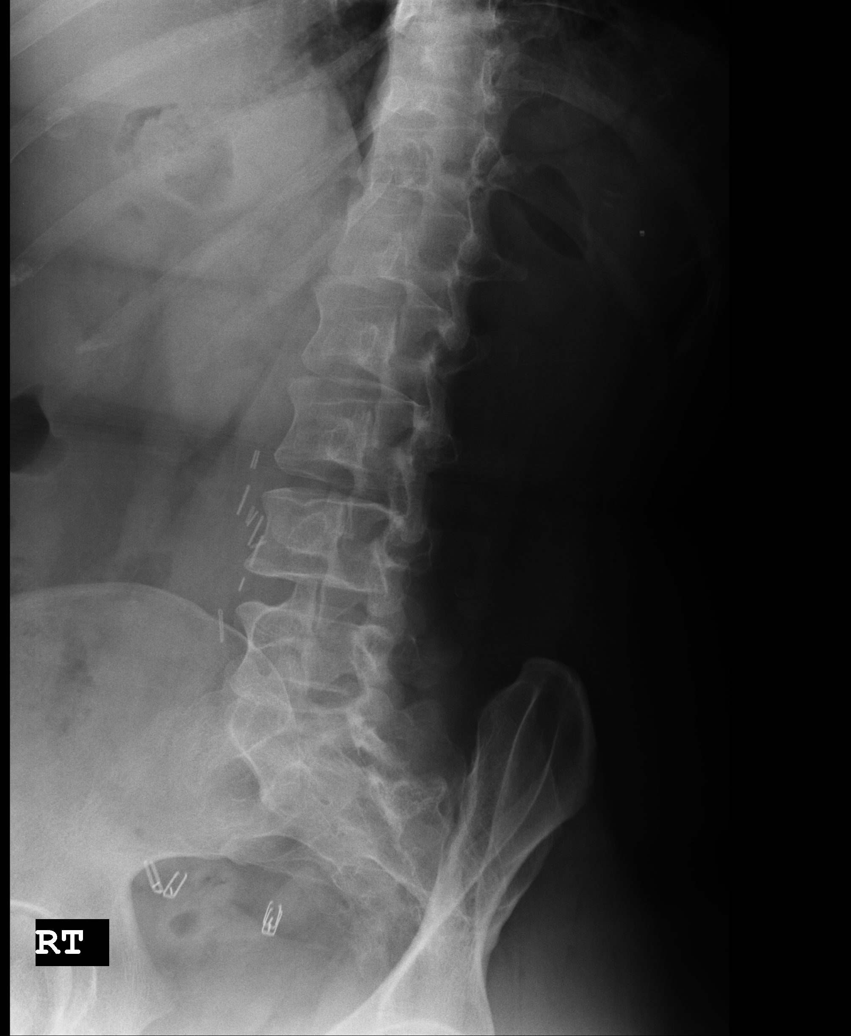
[im 3/5]
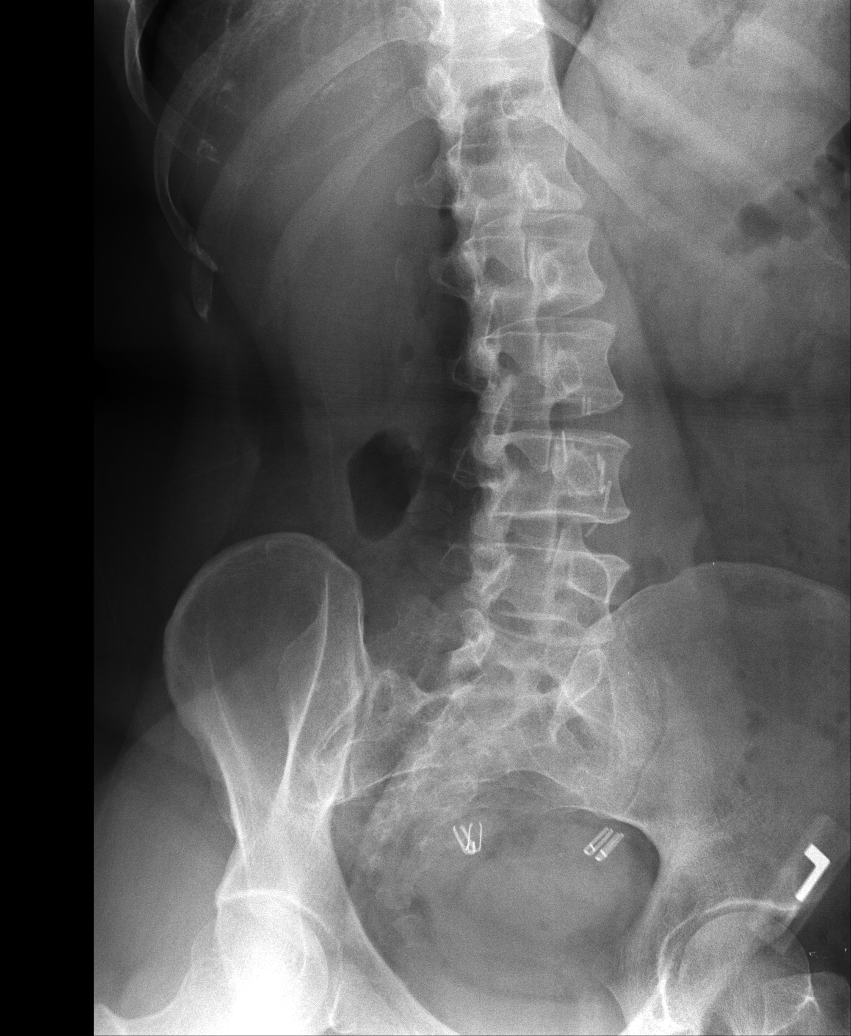
[im 4/5]
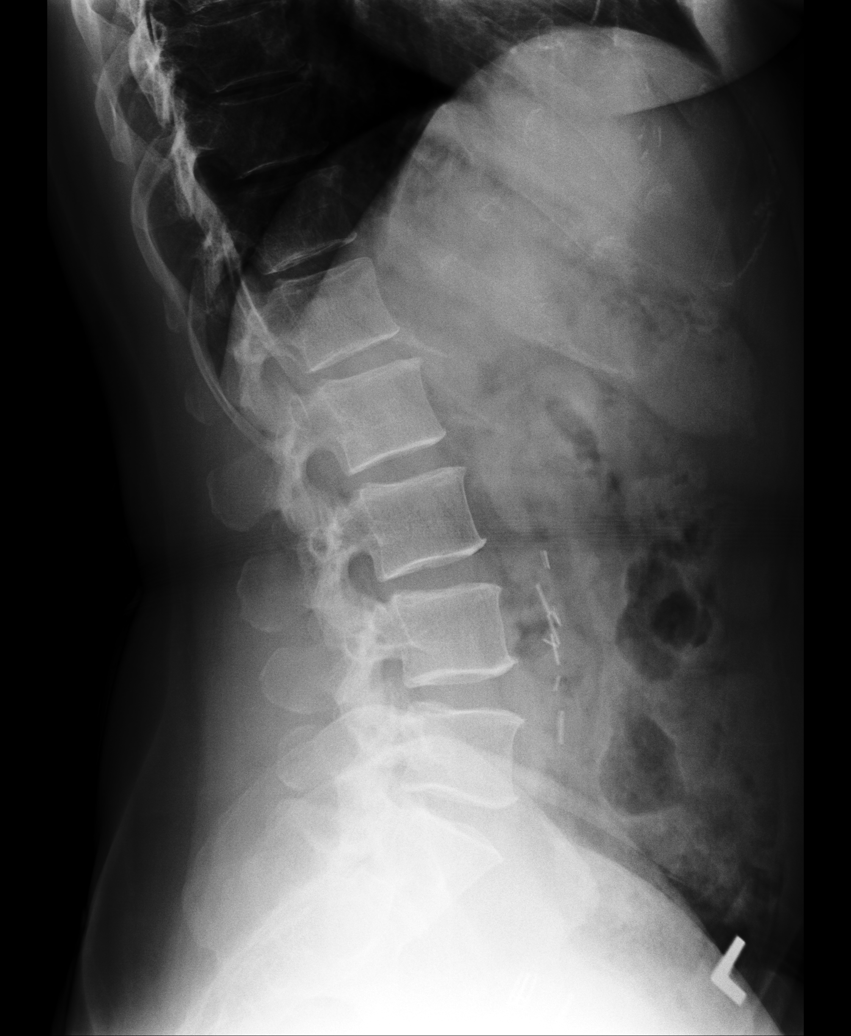
[im 5/5]
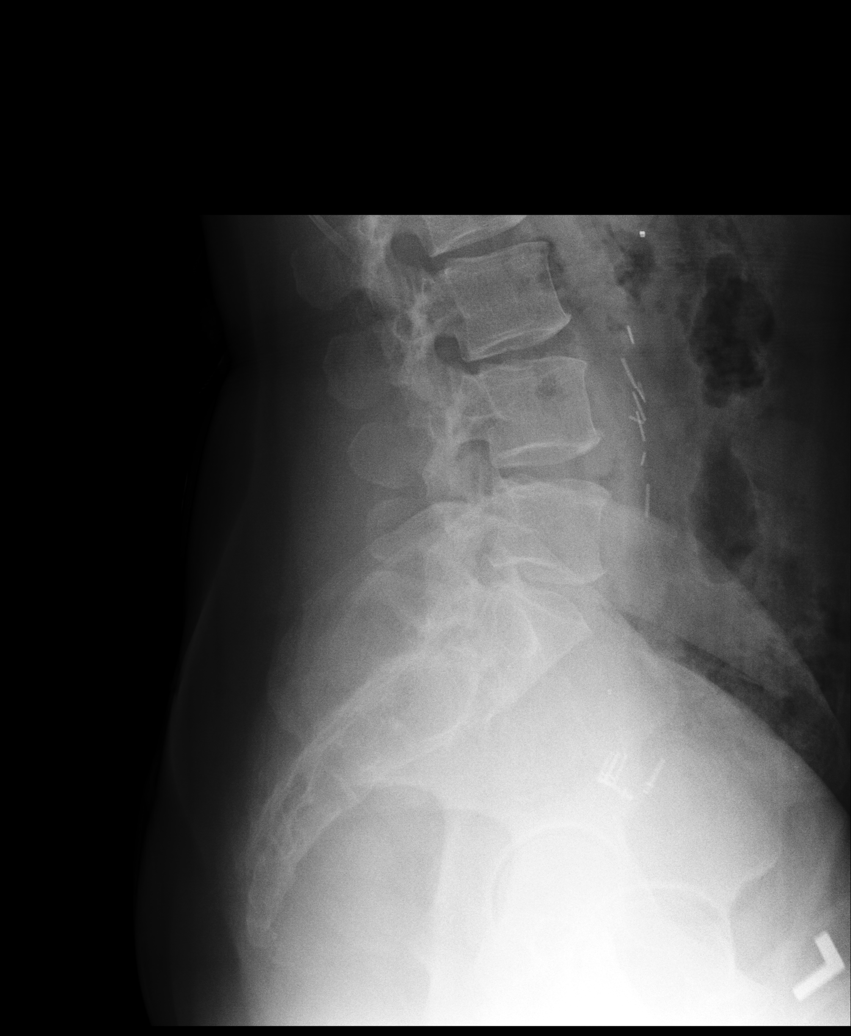

[5 of 5 positions shown; findings below may reference images not displayed]

FINDINGS: Vertebral bodies are normal in height and alignment. There is no acute fracture or subluxation. Disc spaces are well maintained. There is no significant facet arthropathy. No definite pars defect is seen on oblique views. Paraspinal soft tissues are also unremarkable. There is suggestion of a calcified gallstone. Tubal ligation clips are also seen.
IMPRESSION: Unremarkable exam.

## 2021-03-01 IMAGING — MR MRI BRAIN WITHOUT AND WITH CONTRAST
9 of 13 series · 30 of 48 positions shown · IV contrast (gadavist)
Comparison: None available.

﻿EXAM:  MRI BRAIN WITHOUT AND WITH CONTRAST
INDICATION: Suspected multiple sclerosis.
TECHNIQUE: Multiplanar multisequential MRI of the brain was performed without and with 5 mL of Gadavist.

[Series 5: DWI · axial · 5.0mm · 1.35mm/px · z∈[-3,+123]mm · 8 of 88 slices shown (1 of 3)]
[im 1/88]
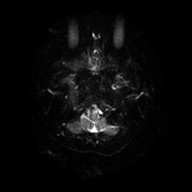
[im 13/88]
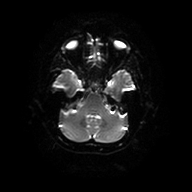
[im 25/88]
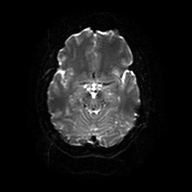
[im 38/88]
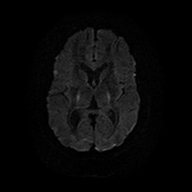
[im 50/88]
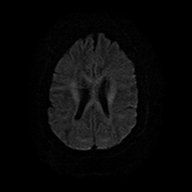
[im 63/88]
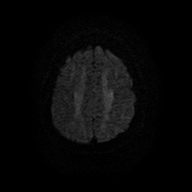
[im 75/88]
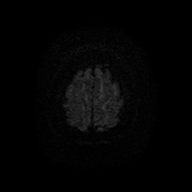
[im 88/88]
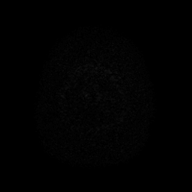

[Series 6: DWI · axial · 5.0mm · 1.35mm/px · 1 of 22 slices shown (2 of 3)]
[im 1/22]
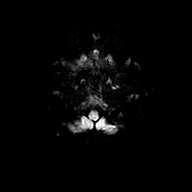

[Series 7: DWI · axial · 5.0mm · 1.35mm/px · z∈[-3,+123]mm · 2 of 22 slices shown (3 of 3)]
[im 1/22]
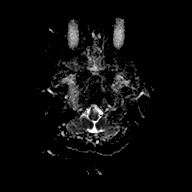
[im 22/22]
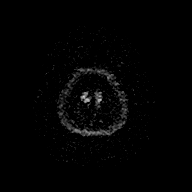

[Series 8: FLAIR · sagittal · 5.0mm · 0.75mm/px · 3 of 32 slices shown (1 of 2)]
[im 1/32]
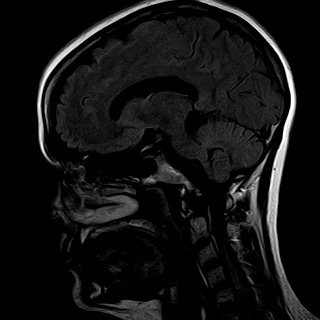
[im 16/32]
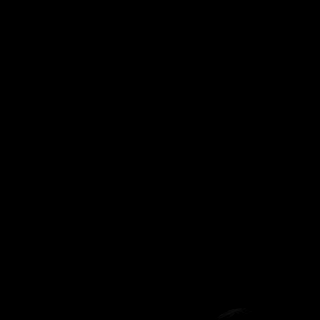
[im 32/32]
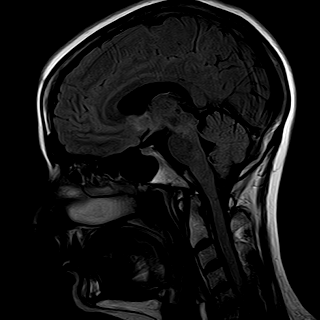

[Series 12: T1 · axial · 4.0mm · 0.43mm/px · z∈[-13,+141]mm · 4 of 36 slices shown]
[im 1/36]
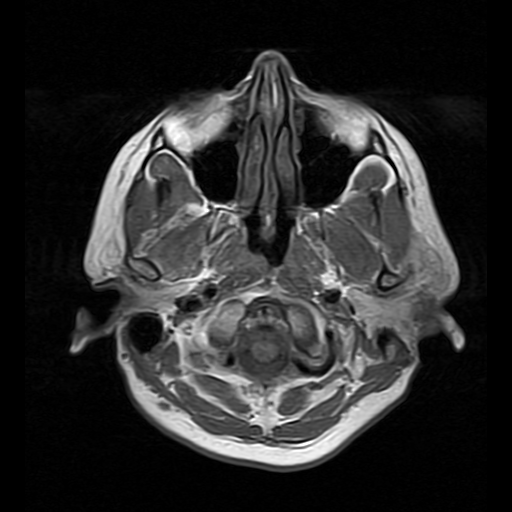
[im 12/36]
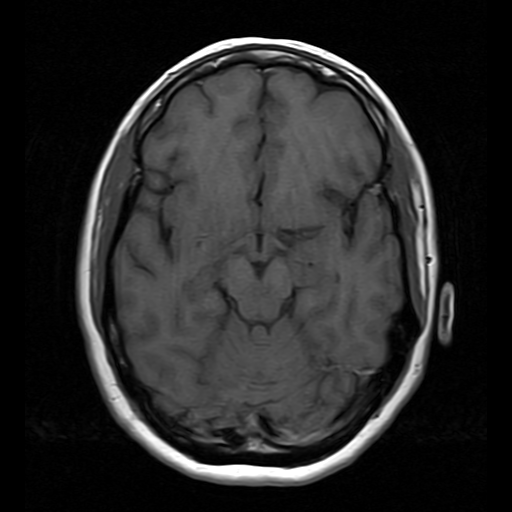
[im 24/36]
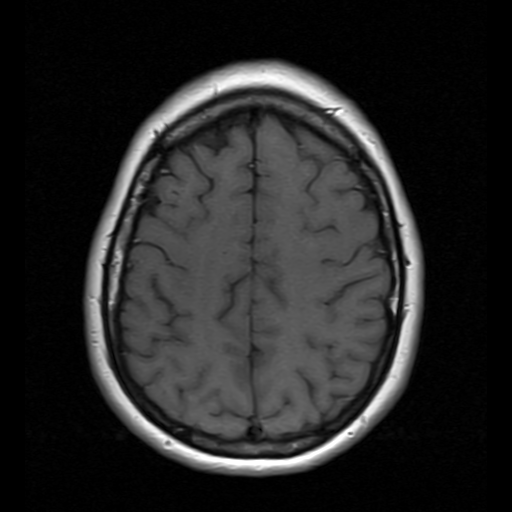
[im 36/36]
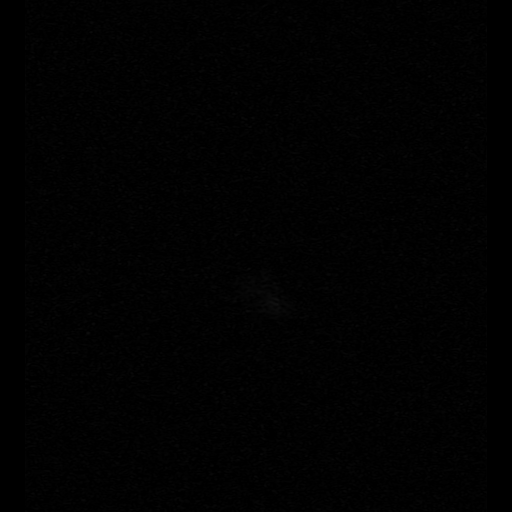

[Series 13: T2 · coronal · 5.0mm · 0.43mm/px · 3 of 28 slices shown]
[im 1/28]
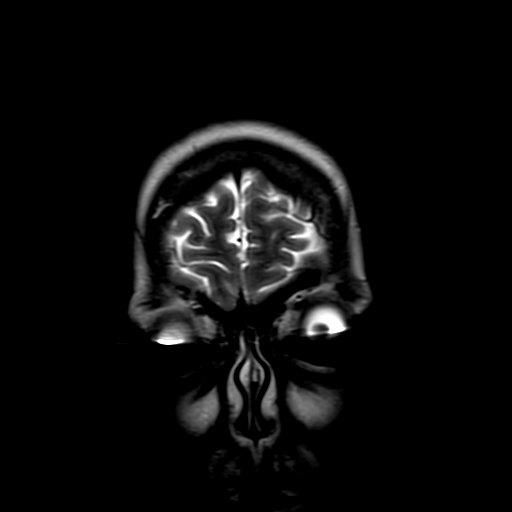
[im 14/28]
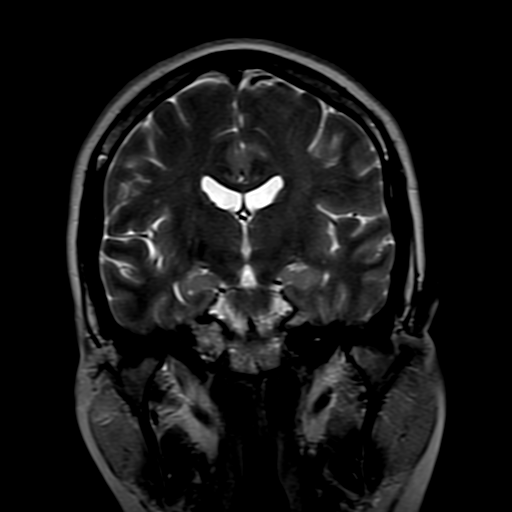
[im 28/28]
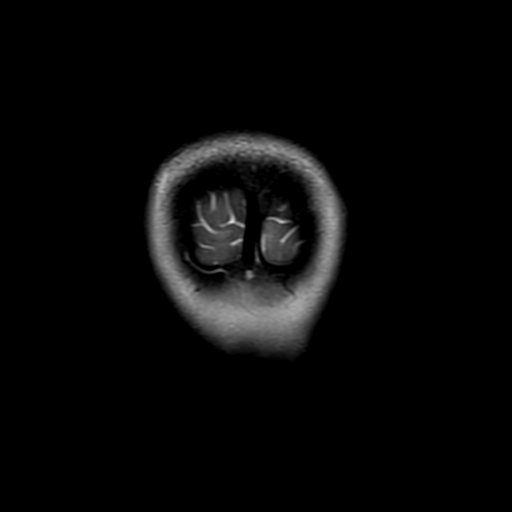

[Series 15: T1 fat-sat · axial · 4.0mm · 0.43mm/px · z∈[-13,+141]mm · 4 of 36 slices shown]
[im 1/36]
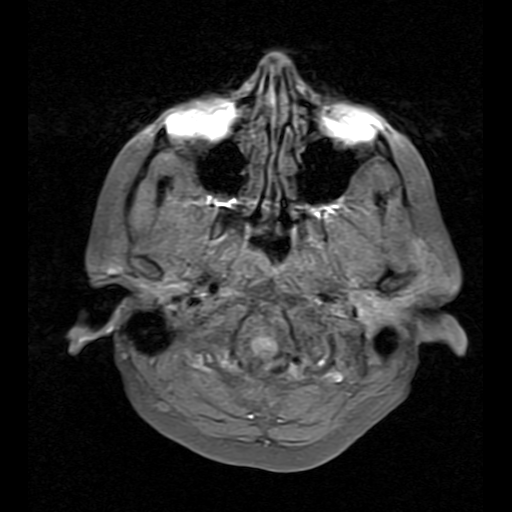
[im 12/36]
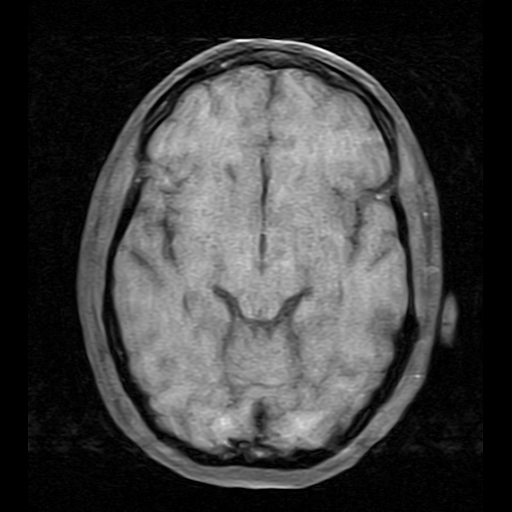
[im 24/36]
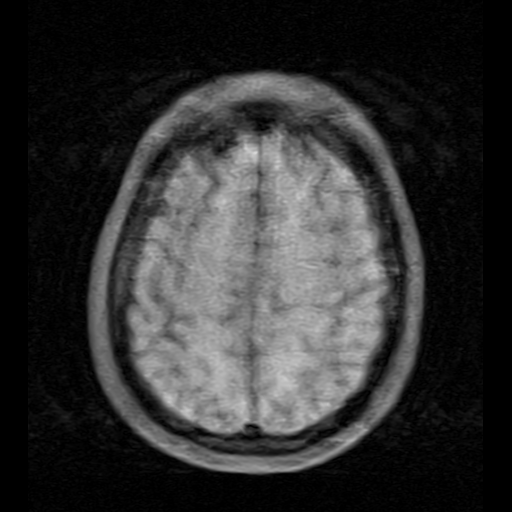
[im 36/36]
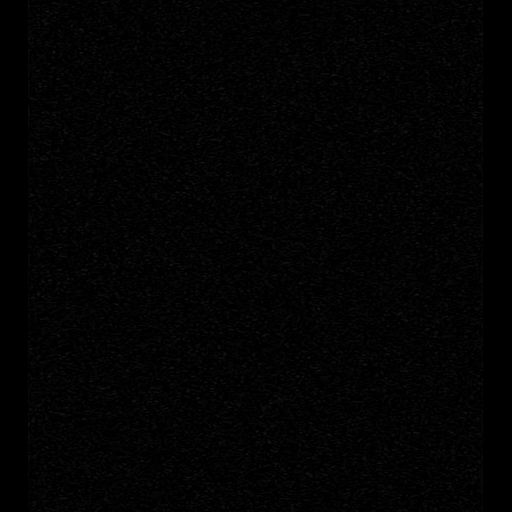

[Series 20: FLAIR · axial · 4.0mm · 0.43mm/px · z∈[-43,+111]mm · 4 of 36 slices shown (2 of 2)]
[im 1/36]
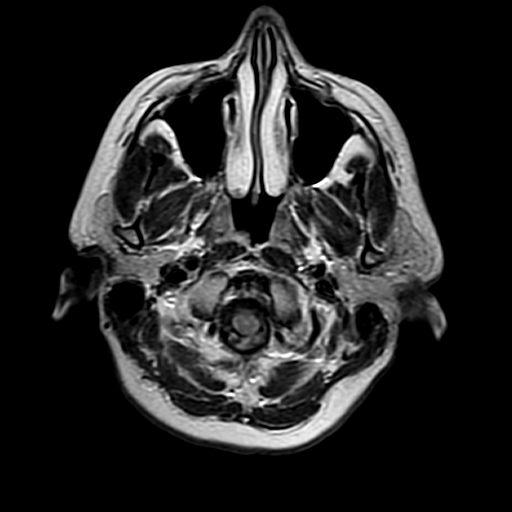
[im 12/36]
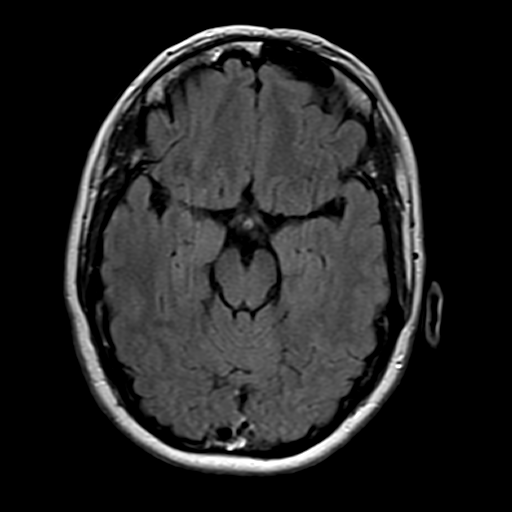
[im 24/36]
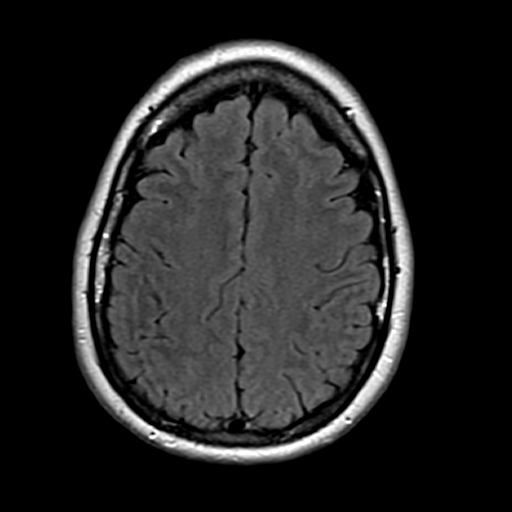
[im 36/36]
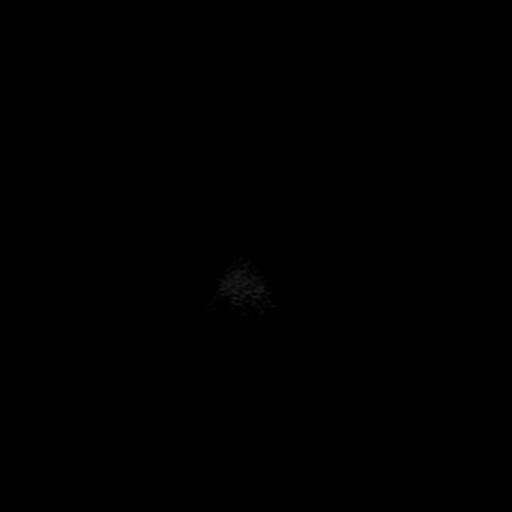

[Series 21: T1 fat-sat post-contrast · axial · 4.0mm · 0.43mm/px · 1 of 36 slices shown]
[im 1/36]
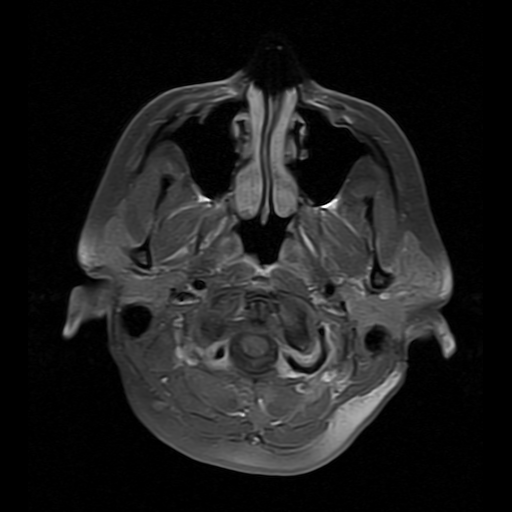

[30 of 48 positions shown; findings below may reference images not displayed]

FINDINGS: Ventricular and sulcal size is normal for the patient's age. There are a between 3-5 small nonspecific foci of abnormal T2/FLAIR signal hyperintensity within the periventricular deep white matter of each cerebral hemisphere. There is no involvement of the corpus callosum. There is no mass effect, midline shift or intracranial hemorrhage. There is no evidence of acute infarction or prior microhemorrhages. Skull base flow voids and basal cisterns are patent. Sagittal survey of midline structures is unremarkable. Following intravenous contrast administration, there is no abnormal parenchymal or leptomeningeal enhancement. There are no extra-axial fluid collections. Visualized paranasal sinuses, mastoid air cells and orbital contents are unremarkable.
IMPRESSION: 1. Suggestion of mild chronic small vessel ischemic changes. No convincing evidence of demyelinating disease. Continued clinical follow-up is recommended. 

 2. No abnormal parenchymal or leptomeningeal enhancement.

## 2021-03-01 IMAGING — MR MRI CERVICAL SPINE WITHOUT AND WITH CONTRAST
9 series · 48 of 48 positions shown · IV contrast (gadavist)
Comparison: None available.

﻿EXAM:  [id number]   MRI CERVICAL SPINE WITHOUT AND WITH CONTRAST
INDICATION: Suspected multiple sclerosis.
TECHNIQUE: Multiplanar multisequential MRI of the cervical spine was performed without and with 5 mL of Gadavist.

[Series 5: T2 · sagittal · 3.0mm · 0.75mm/px · 2 of 13 slices shown (1 of 2)]
[im 1/13]
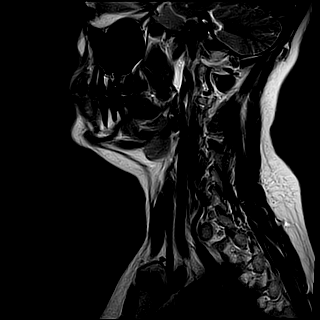
[im 13/13]
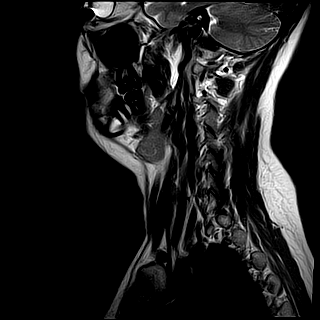

[Series 6: T1 · sagittal · 3.0mm · 0.47mm/px · 2 of 13 slices shown]
[im 1/13]
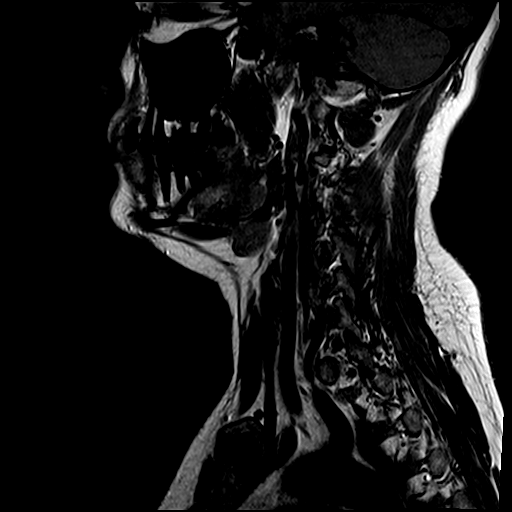
[im 13/13]
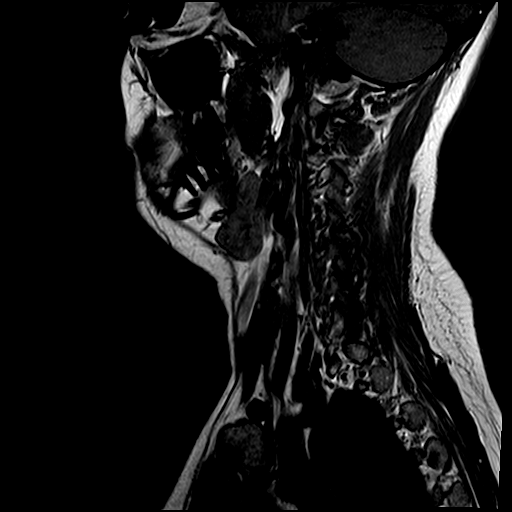

[Series 7: T1 fat-sat · sagittal · 3.0mm · 0.75mm/px · 3 of 13 slices shown (1 of 2)]
[im 1/13]
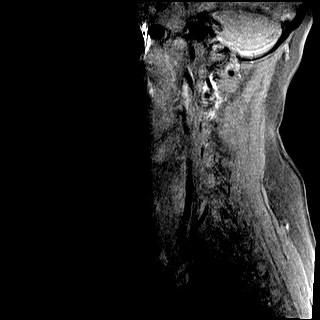
[im 7/13]
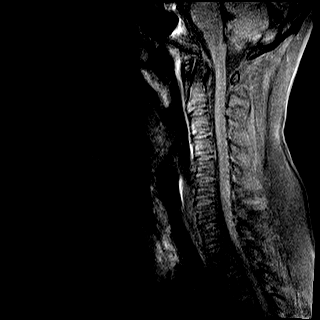
[im 13/13]
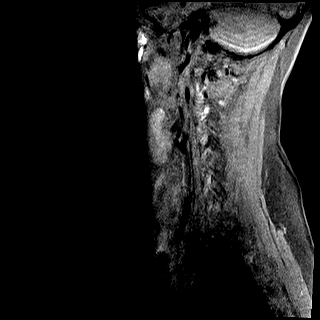

[Series 8: STIR · sagittal · 3.0mm · 0.47mm/px · 3 of 13 slices shown]
[im 1/13]
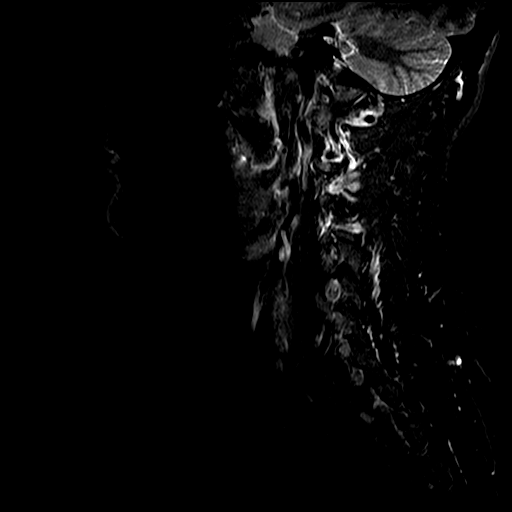
[im 7/13]
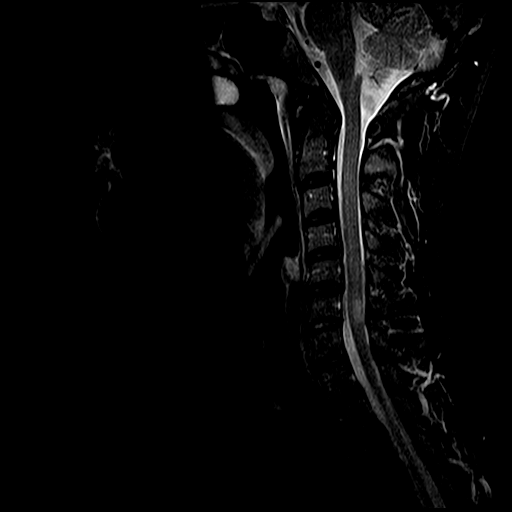
[im 13/13]
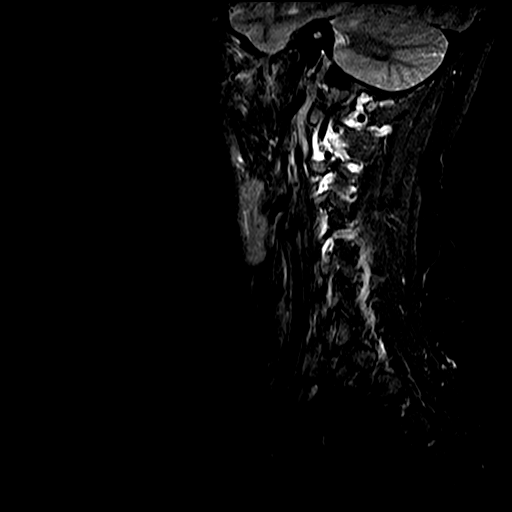

[Series 9: T2 · axial · 3.5mm · 0.35mm/px · z∈[-66,+46]mm · 5 of 26 slices shown (2 of 2)]
[im 1/26]
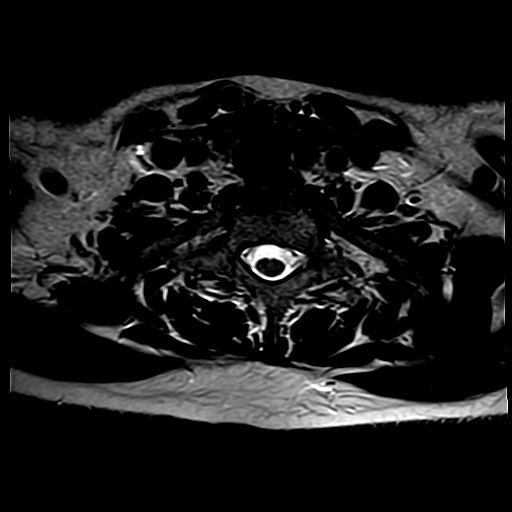
[im 7/26]
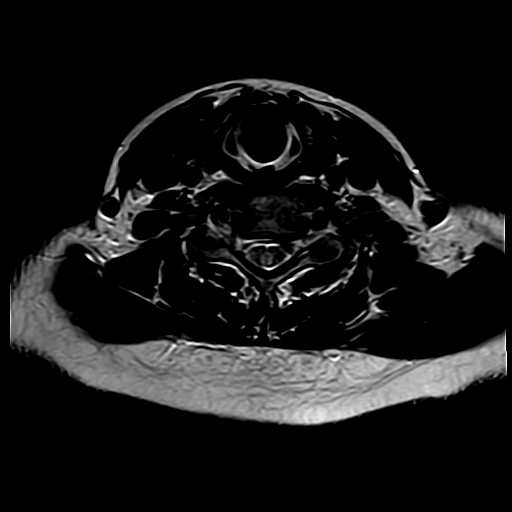
[im 13/26]
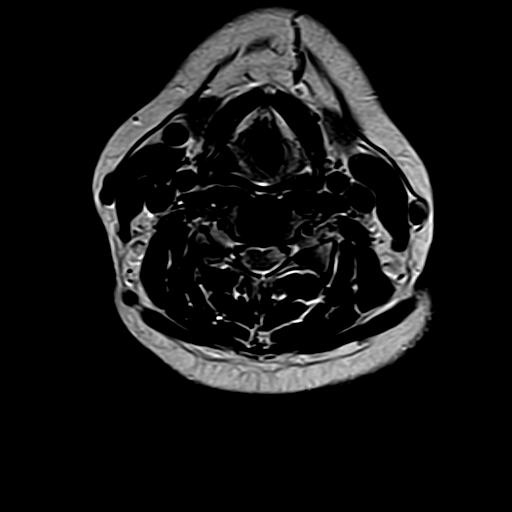
[im 19/26]
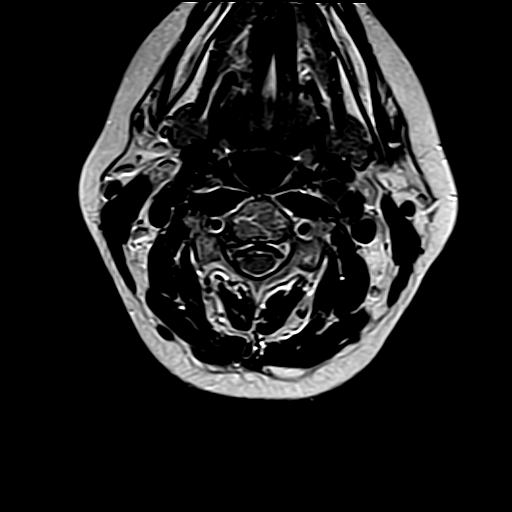
[im 26/26]
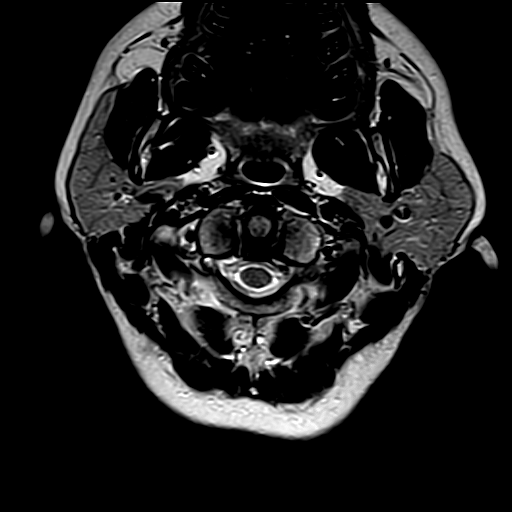

[Series 10: T1 fat-sat · axial · 3.5mm · 0.70mm/px · z∈[-66,+46]mm · 5 of 26 slices shown (2 of 2)]
[im 1/26]
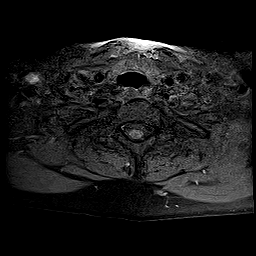
[im 7/26]
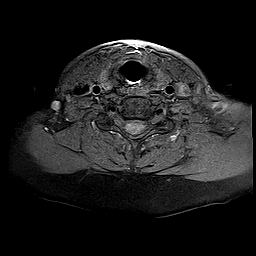
[im 13/26]
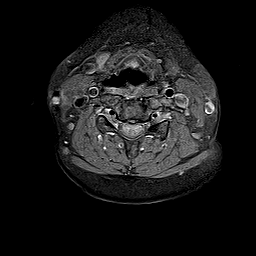
[im 19/26]
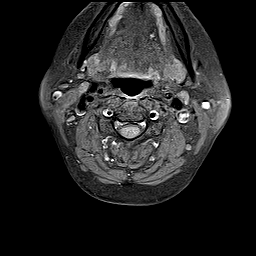
[im 26/26]
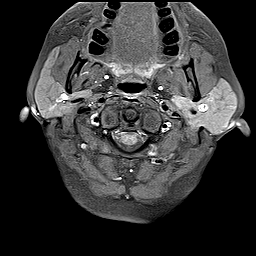

[Series 14: (id) · sagittal · 8.8mm · 4.38mm/px · 20 of 100 slices shown]
[im 1/100]
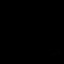
[im 6/100]
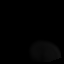
[im 11/100]
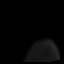
[im 16/100]
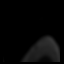
[im 21/100]
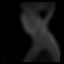
[im 27/100]
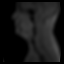
[im 32/100]
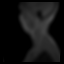
[im 37/100]
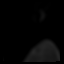
[im 42/100]
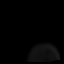
[im 47/100]
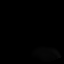
[im 53/100]
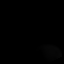
[im 58/100]
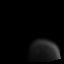
[im 63/100]
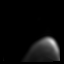
[im 68/100]
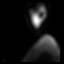
[im 73/100]
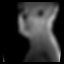
[im 79/100]
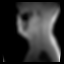
[im 84/100]
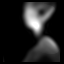
[im 89/100]
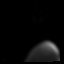
[im 94/100]
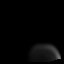
[im 100/100]
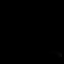

[Series 15: T1 fat-sat post-contrast · axial · 3.5mm · 0.70mm/px · z∈[-70,+41]mm · 5 of 26 slices shown (1 of 2)]
[im 1/26]
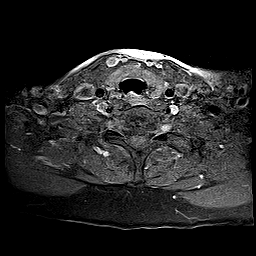
[im 7/26]
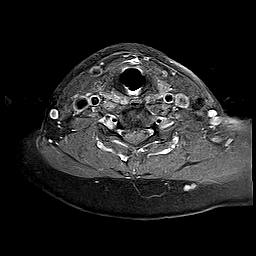
[im 13/26]
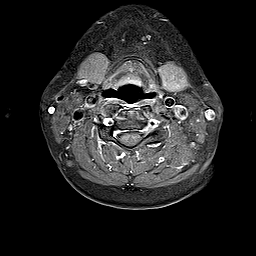
[im 19/26]
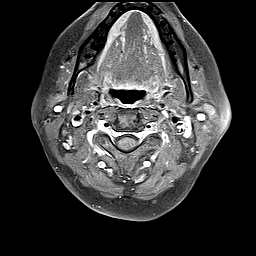
[im 26/26]
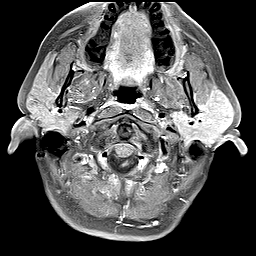

[Series 16: T1 fat-sat post-contrast · sagittal · 3.0mm · 0.75mm/px · 3 of 13 slices shown (2 of 2)]
[im 1/13]
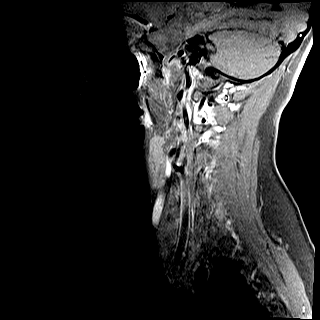
[im 7/13]
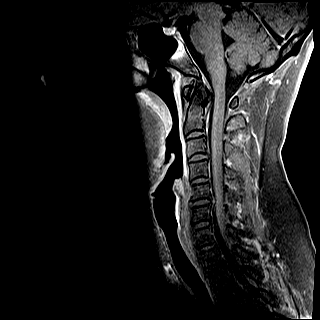
[im 13/13]
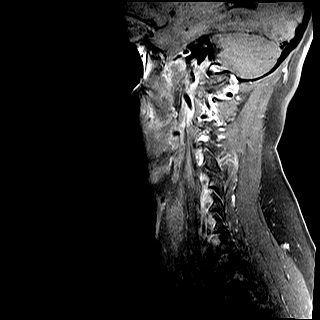

[48 of 48 positions shown; findings below may reference images not displayed]

FINDINGS: Vertebral bodies are normal in height, alignment and signal intensity. There is no acute fracture or subluxation. There is a 16 mm demyelinating plaque within the spinal cord at the level of C4-5 disc space and another 11 mm demyelinating plaque at the level of C6-7 disc space. Faint enhancement is also noted within the plaque at C6-7 disc space.

There is no significant disc herniation, spinal canal or neural foraminal stenosis at any level.

Paraspinal soft tissues are unremarkable.
IMPRESSION: Imaging findings compatible with demyelinating disease such as multiple sclerosis. Faint enhancement within 11 mm demyelinating plaque at the level of C6-7 disc space is suggestive of active demyelination.

## 2021-03-08 IMAGING — MR MRI THORACIC SPINE WITHOUT AND WITH CONTRAST
6 of 8 series · 31 of 48 positions shown · IV contrast (gadavist)
Comparison: None available.

﻿EXAM:  [id number]   MRI THORACIC SPINE WITHOUT AND WITH CONTRAST
INDICATION: Evaluate for multiple sclerosis.
TECHNIQUE: Multiplanar multisequential MRI of the thoracic spine was performed without and with 5 mL of Gadavist.

[Series 5: T2 · sagittal · 4.0mm · 0.78mm/px · 4 of 13 slices shown (1 of 2)]
[im 1/13]
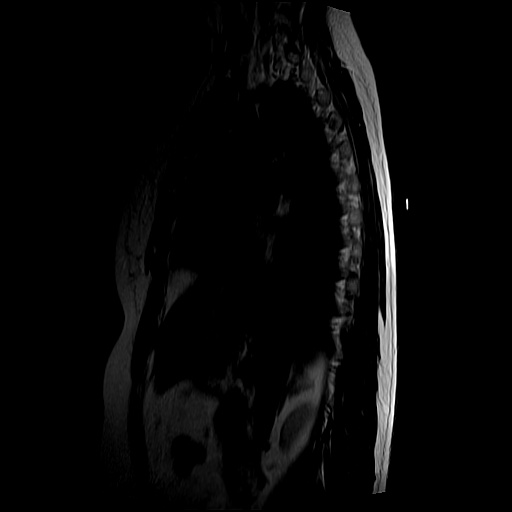
[im 5/13]
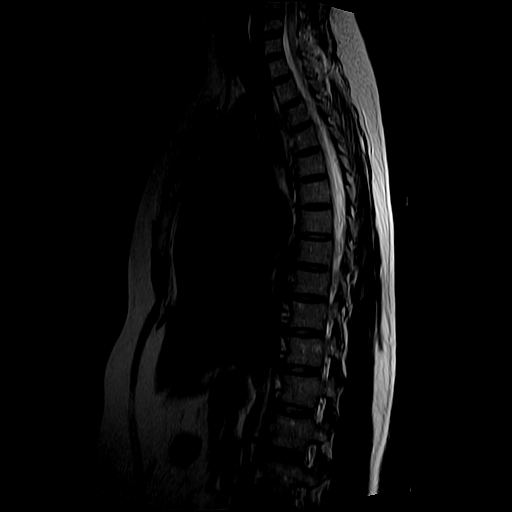
[im 9/13]
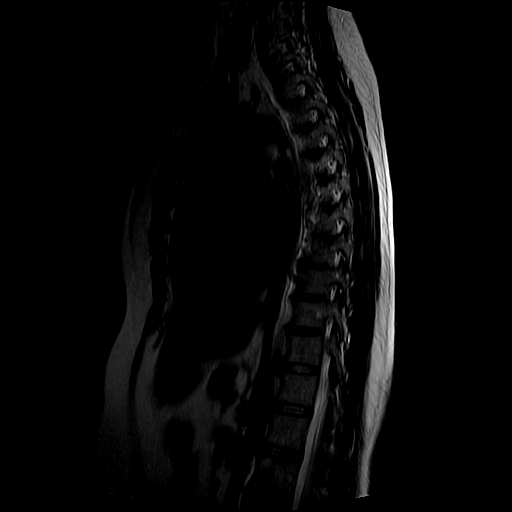
[im 13/13]
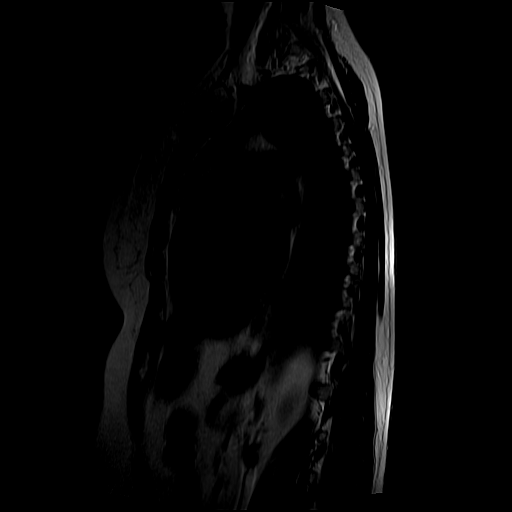

[Series 6: T1 · sagittal · 4.0mm · 0.78mm/px · 5 of 13 slices shown (1 of 2)]
[im 1/13]
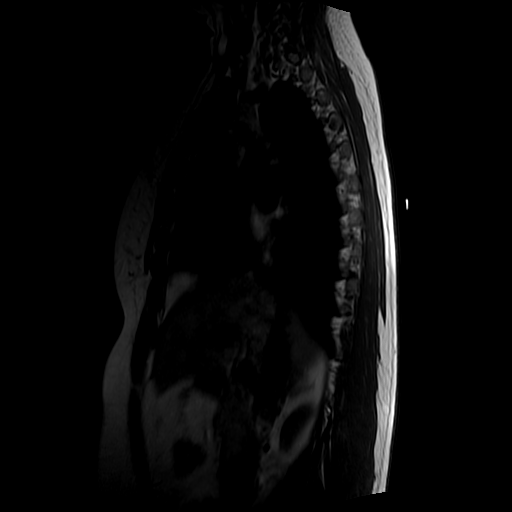
[im 4/13]
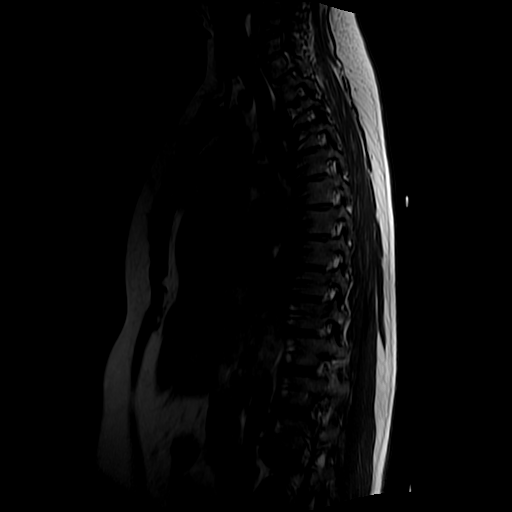
[im 7/13]
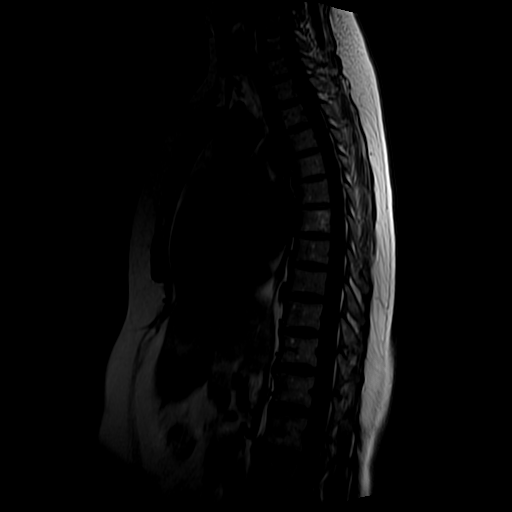
[im 10/13]
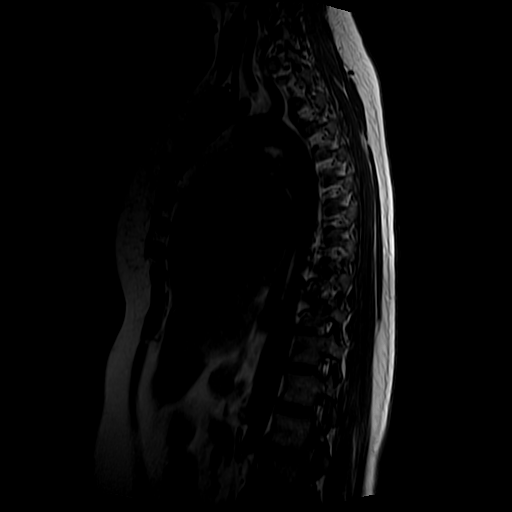
[im 13/13]
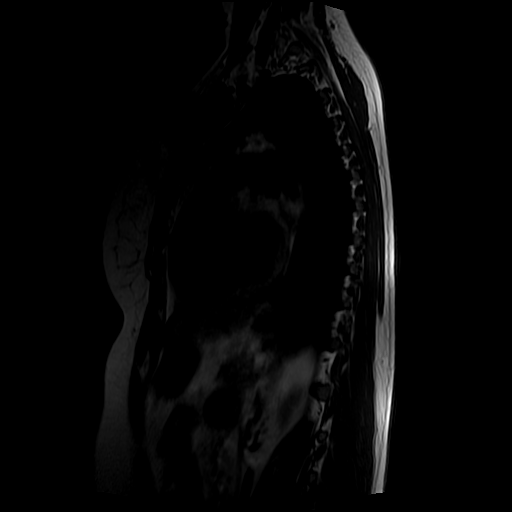

[Series 10: T1 fat-sat · sagittal · 4.0mm · 0.78mm/px · 5 of 13 slices shown (1 of 2)]
[im 1/13]
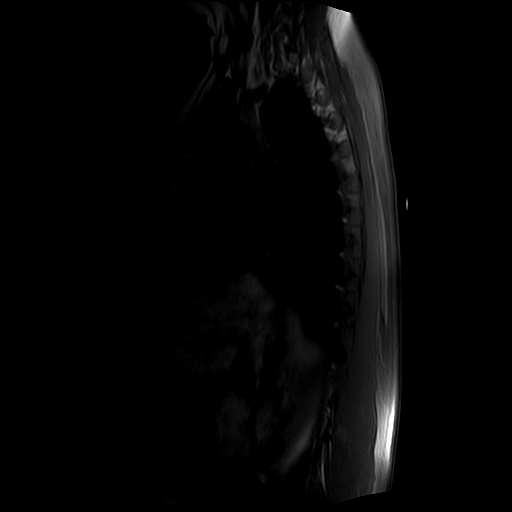
[im 4/13]
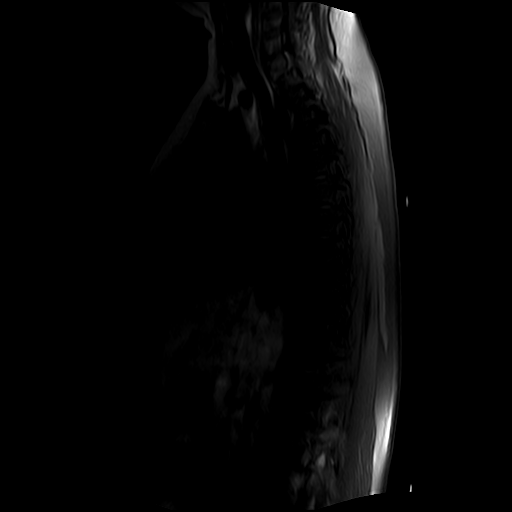
[im 7/13]
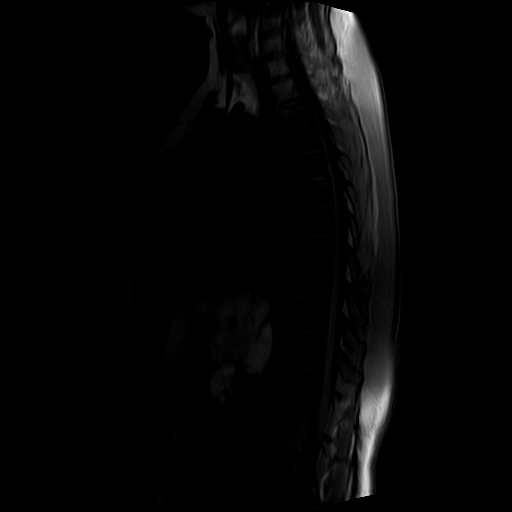
[im 10/13]
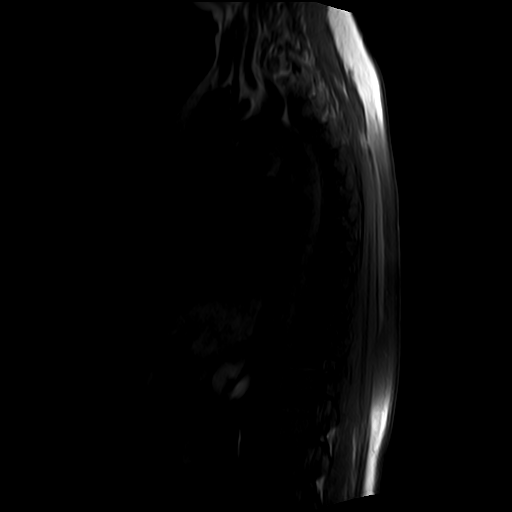
[im 13/13]
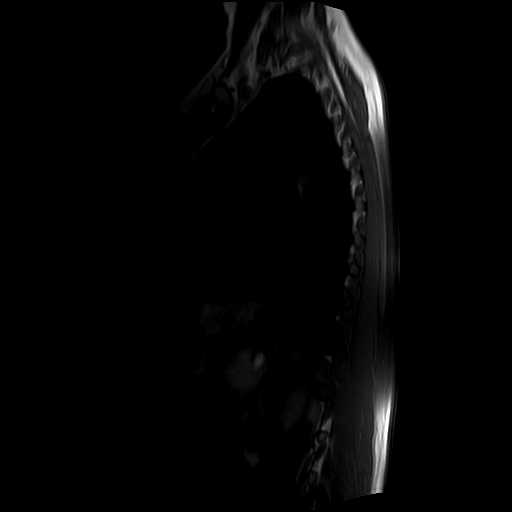

[Series 13: T2 · axial · 4.0mm · 0.62mm/px · z∈[-340,-103]mm · 4 of 12 slices shown (2 of 2)]
[im 1/12]
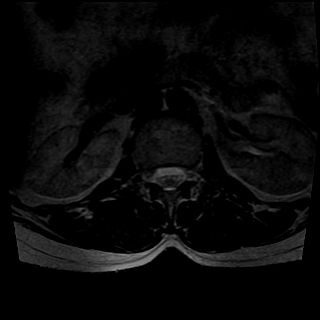
[im 4/12]
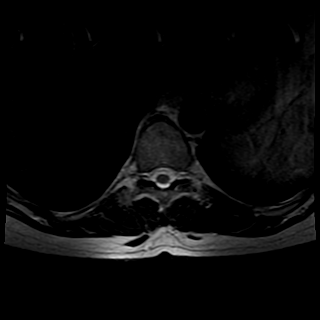
[im 8/12]
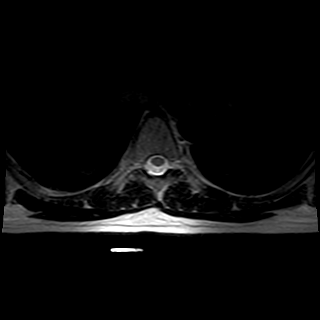
[im 12/12]
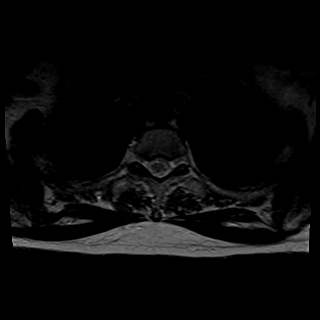

[Series 14: T1 fat-sat · axial · 9.0mm · 0.78mm/px · z∈[-344,-74]mm · 8 of 28 slices shown (2 of 2)]
[im 1/28]
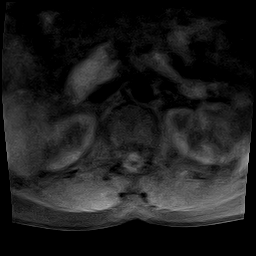
[im 4/28]
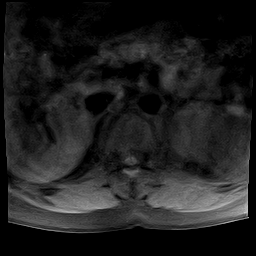
[im 10/28]
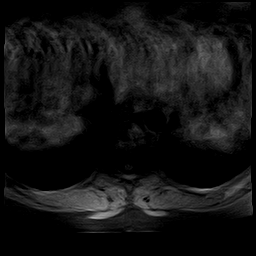
[im 13/28]
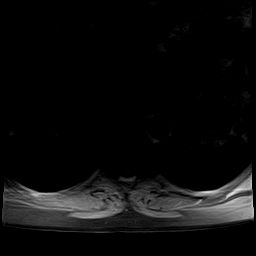
[im 16/28]
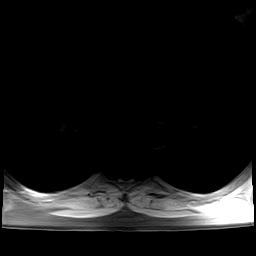
[im 19/28]
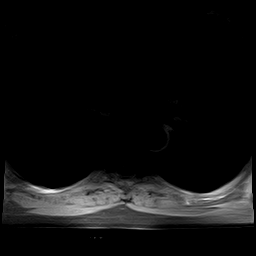
[im 25/28]
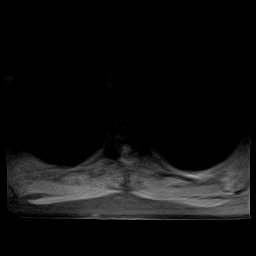
[im 28/28]
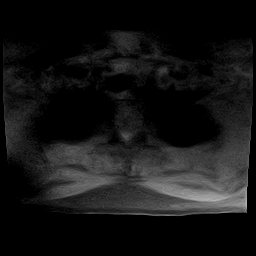

[Series 19: T1 · axial · 9.0mm · 0.78mm/px · z∈[-344,-194]mm · 5 of 28 slices shown (2 of 2)]
[im 1/28]
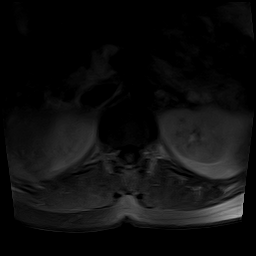
[im 4/28]
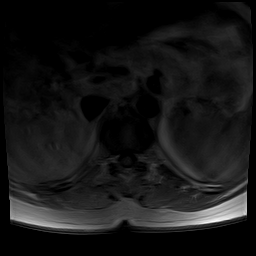
[im 10/28]
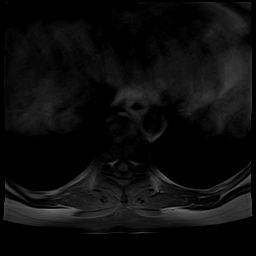
[im 13/28]
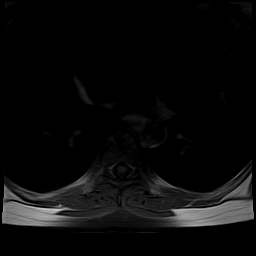
[im 16/28]
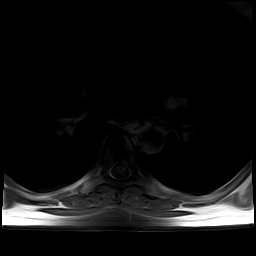

[31 of 48 positions shown; findings below may reference images not displayed]

FINDINGS: Vertebral bodies are normal in height, alignment and signal intensity. There is no acute fracture or subluxation. A small hemangioma is noted within T7 vertebral body. Visualized spinal cord is also normal in signal intensity without evidence of demyelinating disease or compression at any level.

There is no significant disc herniation, spinal canal or neural foraminal stenosis at any level.

Following intravenous contrast administration, there is no abnormal spinal cord or paraspinal soft tissue enhancement. There is no pleural effusion.
IMPRESSION: Unremarkable exam.

## 2021-06-29 ENCOUNTER — Other Ambulatory Visit: Payer: Self-pay

## 2021-06-29 ENCOUNTER — Encounter (INDEPENDENT_AMBULATORY_CARE_PROVIDER_SITE_OTHER): Payer: Self-pay | Admitting: Neurology

## 2021-06-29 ENCOUNTER — Ambulatory Visit: Payer: 59 | Attending: Neurology | Admitting: Neurology

## 2021-06-29 VITALS — BP 142/86 | HR 76 | Ht 68.0 in | Wt 177.2 lb

## 2021-06-29 DIAGNOSIS — R200 Anesthesia of skin: Secondary | ICD-10-CM

## 2021-06-29 DIAGNOSIS — R202 Paresthesia of skin: Secondary | ICD-10-CM

## 2021-06-29 DIAGNOSIS — R531 Weakness: Secondary | ICD-10-CM

## 2021-06-29 DIAGNOSIS — G373 Acute transverse myelitis in demyelinating disease of central nervous system: Secondary | ICD-10-CM | POA: Insufficient documentation

## 2021-06-29 NOTE — H&P (Signed)
[facility]  Outpatient History and Physical    Date:  06/29/2021  Name: Lindsey Bass  Age:  45 y.o.  Referring Physician:   Doristine Mango, MD  [place],  [place] [id number]    History of Present Illness  History obtained from:  patient, accompanied by husband Lindsey Bass is a 45 y.o. female presenting to clinic today for second opinion regarding multiple sclerosis diagnosis. She brought records from outside facility, but this did not include imaging disc.    In February 2022, patient experienced sudden onset of feeling like her "feet were asleep," and this sensation progressed to waist. She saw her endocrinologist who believed it could be low B12. B12 levels were low, started B12 shots. However, by next endocrinology appointment, numbness and tingling had eventually progressed to numbness up to chest. Patient associated weakness but was able to ambulate without assistance during this episode. As per patient she also had covid infection prior to her symptoms with paresthesia  in lower extremity.[facility] did MRI, diagnosed with multiple sclerosis at the end of April 2022. Patient was hospitalized and started on steroids and also received Ocrevus infusion. Scheduled for next dose Ocrevus in October at [facility]. Current neurologist is Dr. Bosie Helper.    Patient denies any other episodes of numbness/tingling/weakness since this hospitalization. She denies experiencing symptoms of optic neuritis. Denies cognitive symptoms. Denies urinary symptoms.    Lindsey Bass is a 44 y.o. female who presents with a chief complaint of   Chief Complaint   Patient presents with    Multiple Sclerosis        The patient reports: no other complaints.     Past Medical History  Current Outpatient Medications   Medication Sig    chlorhexidine gluconate (PERIDEX) 0.12 % Mucous Membrane Mouthwash 15 mL swish and spit Twice daily    cyanocobalamin (VITAMIN B12) 1,000  mcg/mL Injection Solution 1,000 mcg Every 30 days    HYDROcodone-acetaminophen (NORCO) 5-325 mg Oral Tablet Take 1 Tab by mouth Every 4 hours as needed for Pain    Ibuprofen (MOTRIN) 600 mg Oral Tablet Take 1 Tab (600 mg total) by mouth Four times a day as needed for Pain    levothyroxine (SYNTHROID) 112 mcg Oral Tablet Take 112 mcg by mouth Every morning    tetrahydrozoline/polyethyl gly (EYE DROPS OPHT) Administer into affected eye(s)     No Known Allergies  Past Medical History:   Diagnosis Date    Hypothyroidism     Oral lesion     PONV (postoperative nausea and vomiting)     Thyroid disorder     Wears glasses          Past Surgical History:   Procedure Laterality Date    EYE SURGERY      1980's 3 surgeries    HX OTHER  2000    exploratory lap after bleeding from tubal surgery    HX TUBAL LIGATION  2000    MOUTH SURGERY  02/2018    TUMOR EXCISION Right 1986    right leg         Family Medical History:       Problem Relation (Age of Onset)    Breast Cancer Mother (46), Maternal Grandmother    Migraines Father            Social History     Socioeconomic History    Marital status: Married     Spouse name:  Lindsey Bass    Number of children: 3    Highest education level: High school graduate   Tobacco Use    Smoking status: Never Smoker    Smokeless tobacco: Never Used   Vaping Use    Vaping Use: Never used   Substance and Sexual Activity    Alcohol use: Never    Drug use: Never   Other Topics Concern    Ability to Walk 1 Flight of Steps without SOB/CP Yes    Routine Exercise Yes     Comment: runs 2-3 times a week 3 miles    Ability to Walk 2 Flight of Steps without SOB/CP Yes    Ability To Do Own ADL's Yes    Other Activity Level Yes     Comment: cleaning, cooking, grocery shopping, mowing , gardening. NO chest or SOB       Review of Systems  Other than ROS in the HPI, all other systems were negative.    Examination:    Vitals: BP (!) 142/86   Pulse 76   Ht 1.727 m ('5\' 8"'$ )   Wt 80.4 kg (177 lb 4 oz)   BMI 26.95 kg/m        Appearance: No Acute Distress, pleasant  Cardiovascular: No peripheral edema  Orientation: Awake, Alert, and Oriented x 3  Mental status: euthymic, thought process linear and goal-directed  Attention: Normal  Knowledge: Appropriate  Language: No aphasia  Speech: No dysarthria  Cranial Nerves:  2 No Visual Defect on Confrontation; Pupils round, equal, reactive to light  3,4,6 Extraocular Movements Intact; no nystagmus  5 Facial Sensation Intact  7 No facial asymmetry  8 Intact hearing  9,10 Palate symmetric, normal gag  11 Good shoulder shrug  12 Tongue Midline  Gait: Stable, No ataxia  Coordination: No ataxia with finger to nose testing and heel to shin testing  Sensory: Intact, Symmetric to Light Touch  Muscle Tone: Normal  Muscle exam  Arm Right Left Leg Right Left   Deltoid 5/5 5/5 Iliopsoas 5/5 5/5   Biceps 5/5 5/5 Quads 5/5 5/5   Triceps 5/5 5/5 Hamstrings 5/5 5/5   Wrist Extension 5/5 5/5 Ankle Dorsi Flexion 5/5 5/5   Wrist Flexion 5/5 5/5 Ankle Plantar Flexion 5/5 5/5   Interossei   Ankle Eversion 5/5 5/5   APB   Ankle Inversion 5/5 5/5       Reflexes   RJ BJ TJ KJ AJ Plantars Hoffman's   Right 2+ 2+ 2+ 2+ 2+ Downgoing Not present   Left 2+ 2+ 2+ 2+ 2+ Downgoing Not present       I have reviewed the following: see below  Outside notes:   Labs from   Reports:     Independent Interpretation of Imaging:  No records available for interpretation, plan to request release of information.    Assessment and Plan    No diagnosis found.  No orders of the defined types were placed in this encounter.      Lindsey Bass is a 45 y.o. female who presents for second opinion on recent multiple sclerosis diagnosis. Patient initially episode of ascending paresthesia in February 2022. She underwent MRI imaging at a local facility (records not available) and was diagnosed with multiple sclerosis. She was hospitalized at the end of April 2022 and started on steroids at that time. She also received split dose of Ocrevus on 5/2  and 5/16. Patient reports symptoms resolved within a few days of steroids and initial dose  of Ocrevus. She currently follows with neurologist Dr. Doristine Mango through [facility]. In clinic today, neurologic exam is within normal limits, no upper motor neuron signs.       Multiple Sclerosis vs Transverse Myelitis  - Cannot confirm diagnosis of multiple sclerosis without mri imaging/disc of previous images   - Request release of information from [facility]  - Once previous MRI is available for independent review, will call to discuss interpretation and diagnosis  - May need LP in the future  - Received initial Ocrevus infusions in May; next dose scheduled for October  - Continue to follow with current neurologist Dr. Van Clines, MD - 06/29/21 at 10:08  PGY-1, Behavioral Medicine and Psychiatry  [facility]  The above note by the medical student reflects the student's collection of patient data in the presence of the resident and myself other than ROS, FH, and SH.  I was physically present during this data collection, have modified (if necessary) the note to reflect my own, personal collection/validation of the patient's HPI, patient history, physical examination, and assessment and plan.  Catalina Gravel, MD

## 2021-09-02 ENCOUNTER — Ambulatory Visit (INDEPENDENT_AMBULATORY_CARE_PROVIDER_SITE_OTHER): Payer: 59 | Admitting: Neurology

## 2022-02-15 ENCOUNTER — Ambulatory Visit: Payer: 59 | Attending: Neurology | Admitting: Neurology

## 2022-02-15 DIAGNOSIS — G35 Multiple sclerosis: Secondary | ICD-10-CM | POA: Insufficient documentation

## 2022-02-15 NOTE — Progress Notes (Signed)
NEUROLOGY, PHYSICIAN OFFICE CENTER  [place] [id number]  Operated by [place]  Telephone Visit    Name:  Lindsey Bass MRN: [id number]   Date:  02/15/2022 Age:   46 y.o.     The patient/family initiated a request for telephone service.  Verbal consent for this service was obtained from the patient/family.    Last office visit in this department: 06/29/2021      Reason for call: Follow up  Call notes:  -Confirmed history - February 2022 developed ascending paresthesias. MRI at outside facility with spine lesions. Minimal amount of brain lesions not 100% consistent with demyelinating lesions. In hindsight did have one episode 23 years ago in which she developed numbness in left arm and right leg after giving birth to a child, which resolved with PT. Got initial course of Ocrevus in May 2022 but did not get subsequent doses due to diagnostic uncertainty after evaluation here. Has been clinically stable since last year.    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 However, at high risk of progressing to CDMS in the future given her spine lesions. Discussed risks and benefits of restarting DMT vs watchful waiting. She has concerns about more Ocrevus given theoretical risks of breast cancer, as her mother had breast cancer - discussed that these concerns have not borne out in the post-marketing setting, but that regardless there are numerous other options for DMT. Will start with updating images and in-person clinic visit. Check NMO and MOG also on same day as clinic visit.      ICD-10-CM    1. Multiple sclerosis (CMS [place])  G35           Total provider time spent with the patient on the phone: 15 minutes.    Dorette Grate, MD

## 2022-02-16 ENCOUNTER — Encounter (HOSPITAL_COMMUNITY): Payer: Self-pay

## 2022-02-21 ENCOUNTER — Encounter (INDEPENDENT_AMBULATORY_CARE_PROVIDER_SITE_OTHER): Payer: Self-pay

## 2022-03-21 ENCOUNTER — Ambulatory Visit (INDEPENDENT_AMBULATORY_CARE_PROVIDER_SITE_OTHER): Payer: Self-pay

## 2022-04-28 ENCOUNTER — Encounter (INDEPENDENT_AMBULATORY_CARE_PROVIDER_SITE_OTHER): Payer: Self-pay | Admitting: Neurology

## 2022-04-28 ENCOUNTER — Inpatient Hospital Stay (HOSPITAL_BASED_OUTPATIENT_CLINIC_OR_DEPARTMENT_OTHER)
Admission: RE | Admit: 2022-04-28 | Discharge: 2022-04-28 | Disposition: A | Discharge status: Home / Self Care | Payer: 59 | Source: Ambulatory Visit

## 2022-04-28 ENCOUNTER — Ambulatory Visit: Payer: 59 | Attending: Neurology | Admitting: Neurology

## 2022-04-28 ENCOUNTER — Other Ambulatory Visit: Payer: Self-pay

## 2022-04-28 VITALS — BP 144/83 | HR 73 | Ht 68.0 in | Wt 177.0 lb

## 2022-04-28 DIAGNOSIS — G35 Multiple sclerosis: Secondary | ICD-10-CM | POA: Insufficient documentation

## 2022-04-28 MED ORDER — GADOBUTROL 10 MMOL/10 ML (1 MMOL/ML) INTRAVENOUS SOLUTION
8.0000 mL | INTRAVENOUS | Status: AC
Start: 2022-04-28 — End: 2022-04-28
  Administered 2022-04-28: 8 mL via INTRAVENOUS

## 2022-04-28 NOTE — Progress Notes (Unsigned)
[facility]  Multiple Sclerosis and Clinical Neuroimmunology Clinic      Date: 04/28/2022  Name: Lindsey Bass  Age:  46 y.o.  Referring Physician:   No referring provider defined for this encounter.    History of Present Illness:  History obtained from:  {HISTORY PROVIDED BY:19225::"patient"}    ------------------------  Please note, some information in this section carried forward from prior notes. New information denoted with a *    Disease categorization:  Date diagnosed:    Current DMT:  Previous DMTs:  Steroid history:    Neurologic history:    Pertinent studies:    ------------------------    Lindsey Bass is a 46 y.o. female who returns in follow up for   Chief Complaint   Patient presents with   . MRI Results      Since last visit:  -No new symptoms. Legs still hurt but nothing new.  -No new symptoms.  -No Ocrevus x1 year.      Past Medical History:   Diagnosis Date   . Hypothyroidism    . Oral lesion    . PONV (postoperative nausea and vomiting)    . Thyroid disorder    . Wears glasses          Past Surgical History:   Procedure Laterality Date   . EYE SURGERY      1980's 3 surgeries   . HX OTHER  2000    exploratory lap after bleeding from tubal surgery   . HX TUBAL LIGATION  2000   . MOUTH SURGERY  02/2018   . TUMOR EXCISION Right 1986    right leg         Current Outpatient Medications   Medication Sig   . cyanocobalamin (VITAMIN B12) 1,000 mcg/mL Injection Solution 1 mL (1,000 mcg total) Every 30 days   . Ibuprofen (MOTRIN) 600 mg Oral Tablet Take 1 Tab (600 mg total) by mouth Four times a day as needed for Pain   . levothyroxine (SYNTHROID) 112 mcg Oral Tablet Take 1 Tablet (112 mcg total) by mouth Every morning   . tetrahydrozoline/polyethyl gly (EYE DROPS OPHT) Administer into affected eye(s)     No Known Allergies  Family Medical History:     Problem Relation (Age of Onset)    Breast Cancer Mother (23), Maternal Grandmother    Migraines Father      No FH of M.S.    Social History      Socioeconomic History   . Marital status: Married     Spouse name: Elta [place]   . Number of children: 3   . Highest education level: High school graduate   Tobacco Use   . Smoking status: Never   . Smokeless tobacco: Never   Vaping Use   . Vaping Use: Never used   Substance and Sexual Activity   . Alcohol use: Never   . Drug use: Never   Other Topics Concern   . Ability to Walk 1 Flight of Steps without SOB/CP Yes   . Routine Exercise Yes     Comment: runs 2-3 times a week 3 miles   . Ability to Walk 2 Flight of Steps without SOB/CP Yes   . Ability To Do Own ADL's Yes   . Other Activity Level Yes     Comment: cleaning, cooking, grocery shopping, mowing , gardening. NO chest or SOB   No smoking. No EtOH. Works for [employer]. Teacher's aide, special needs.  Review of Systems:  Gen: No malaise. ID: No infections. HEENT: No hearing or vision changes. Respiratory: No shortness of breath or cough. Cardiac: No chest pain. GU: No hematuria. GI: No GI bleeding. Extremities: No peripheral edema. Musculoskeletal: No joint pain, swelling. Neuro: As above. Psychiatric: No mood changes.    Examination:    Vitals: BP (!) 144/83   Pulse 73   Ht 1.727 m ('5\' 8"'$ )   Wt 80.3 kg (177 lb)   SpO2 98%   BMI 26.91 kg/m       General Exam:  General: No acute distress.  HEENT: No scleral icterus.  Psychiatric: Affect calm.    Neurologic Exam:  Ophthalmoscopic: {opthalm:42510::"Limited undilated exam, no obvious papilledema or pallor."}  Orientation: Awake, alert, appropriately oriented.  Memory: Recent and remote memory appear intact. Formal memory testing not completed today.  Attention: Normal in conversation.  Knowledge: Appropriate.  Language: Fluent with intact comprehension.  Speech: Normal.  Cranial nerves: Pupils are equal and reactive bilaterally. Extraocular movements are intact. Facial sensation intact. Face activates symmetrically. Hearing intact to finger rub bilaterally. Shoulder shrug 5/5 bilaterally. Tongue and  uvula midline.   Sensory: {Sensory:42220}  Muscle tone: Normal.    Muscle exam:  Arm Right Left Leg Right Left   Deltoid {number 1-5:25396::"5"}/5 {number 1-5:25396::"5"}/5 Iliopsoas {number 1-5:25396::"5"}/5 {number 1-5:25396::"5"}/5   Biceps {number 1-5:25396::"5"}/5 {number 1-5:25396::"5"}/5 Quads {number 1-5:25396::"5"}/5 {number 1-5:25396::"5"}/5   Triceps {number 1-5:25396::"5"}/5 {number 1-5:25396::"5"}/5 Hamstrings {number 1-5:25396::"5"}/5 {number 1-5:25396::"5"}/5   Interossei {number 1-5:25396::"5"}/5 {number 1-5:25396::"5"}/5 Ankle Dorsi Flexion {number 1-5:25396::"5"}/5 {number 1-5:25396::"5"}/5   APB {number 1-5:25396::"5"}/5 {number 1-5:25396::"5"}/5 Ankle Plantar Flexion {number 1-5:25396::"5"}/5 {number 1-5:25396::"5"}/5     Reflexes:   RJ BJ TJ KJ AJ Plantars   Right {Reflex:42509::"2+"} {Reflex:42509::"2+"} {Reflex:42509::"2+"} {Reflex:42509::"2+"} {Reflex:42509::"2+"} {U/D/M:42218::"Downgoing"}   Left {Reflex:42509::"2+"} {Reflex:42509::"2+"} {Reflex:42509::"2+"} {Reflex:42509::"2+"} {Reflex:42509::"2+"} {U/D/M:42218::"Downgoing"}     Coordination: Finger-nose without dysmetria bilaterally.  Gait: Normal casual gait. Negative Romberg.    Assessment and Plan:    No diagnosis found.     Check about [place] pictures    No orders of the defined types were placed in this encounter.    No follow-ups on file.    Wearables candidate? {YES/NO:20834}  Fenebrutinib candidate? {YES/NO:20834}  Ofatumumab candidate?{YES/NO:20834}  Fatigue study? {YES/NO:20834}  Cognitive rehab? {YES/NO:20834}  Smoking cessation? {YES/NO:20834}      Dorette Grate, MD 04/28/2022   Assistant Professor, Department of Neurology

## 2022-04-29 DIAGNOSIS — G35 Multiple sclerosis: Secondary | ICD-10-CM

## 2022-05-01 ENCOUNTER — Encounter (INDEPENDENT_AMBULATORY_CARE_PROVIDER_SITE_OTHER): Payer: Self-pay | Admitting: Neurology

## 2022-05-04 ENCOUNTER — Encounter (INDEPENDENT_AMBULATORY_CARE_PROVIDER_SITE_OTHER): Payer: Self-pay | Admitting: Neurology

## 2022-05-06 ENCOUNTER — Telehealth (INDEPENDENT_AMBULATORY_CARE_PROVIDER_SITE_OTHER): Payer: Self-pay | Admitting: Neurology

## 2022-05-06 NOTE — Telephone Encounter (Addendum)
Talked to pt on 6/15. Discussed MRI results as mentioned in my MyChart message to her. Discussed risks/benefits of starting DMT vs very close watchful waiting. Discussed that I would tend to treat in these situations. Discussed options including Copaxone, Aubagio, Mavenclad, Ocrevus (which she was on before) and Kesimpta. If she would prefer not to be on DMT I would be okay with close monitoring. She will think about options and let me know. Sent MyChart message with more details about the meds we discussed.

## 2022-05-24 ENCOUNTER — Encounter (INDEPENDENT_AMBULATORY_CARE_PROVIDER_SITE_OTHER): Payer: Self-pay | Admitting: Neurology

## 2022-05-26 MED ORDER — DEXAMETHASONE 4 MG TABLET
ORAL_TABLET | ORAL | 0 refills | Status: AC
Start: 2022-05-26 — End: ?

## 2022-05-26 NOTE — Telephone Encounter (Signed)
Decadron dose pack sent in for headache.  Asked patient to let us know if symptoms are improved after finishing.

## 2022-07-12 ENCOUNTER — Encounter (INDEPENDENT_AMBULATORY_CARE_PROVIDER_SITE_OTHER): Payer: Self-pay | Admitting: Neurology

## 2022-07-12 DIAGNOSIS — R5381 Other malaise: Secondary | ICD-10-CM

## 2022-07-12 DIAGNOSIS — G35 Multiple sclerosis: Secondary | ICD-10-CM

## 2022-07-14 ENCOUNTER — Other Ambulatory Visit (INDEPENDENT_AMBULATORY_CARE_PROVIDER_SITE_OTHER): Payer: Self-pay | Admitting: Neurology

## 2022-07-14 DIAGNOSIS — R9389 Abnormal findings on diagnostic imaging of other specified body structures: Secondary | ICD-10-CM

## 2022-07-18 ENCOUNTER — Other Ambulatory Visit: Payer: Self-pay

## 2022-07-18 ENCOUNTER — Other Ambulatory Visit: Payer: 59 | Attending: Nurse Practitioner

## 2022-07-18 ENCOUNTER — Encounter (INDEPENDENT_AMBULATORY_CARE_PROVIDER_SITE_OTHER): Payer: Self-pay | Admitting: Neurology

## 2022-07-18 DIAGNOSIS — R5381 Other malaise: Secondary | ICD-10-CM | POA: Insufficient documentation

## 2022-07-18 DIAGNOSIS — G35 Multiple sclerosis: Secondary | ICD-10-CM | POA: Insufficient documentation

## 2022-07-18 LAB — URINALYSIS, MACROSCOPIC
BILIRUBIN: NEGATIVE mg/dL
BLOOD: 0.1 mg/dL — AB
GLUCOSE: NEGATIVE mg/dL
KETONES: NEGATIVE mg/dL
LEUKOCYTES: NEGATIVE WBCs/uL
NITRITE: NEGATIVE
PH: 5.5 (ref 5.0–9.0)
PROTEIN: NEGATIVE mg/dL
SPECIFIC GRAVITY: 1.026 (ref 1.002–1.030)
UROBILINOGEN: NORMAL mg/dL

## 2022-07-18 LAB — CBC WITH DIFF
BASOPHIL #: 0 10*3/uL (ref 0.00–0.30)
BASOPHIL %: 1 % (ref 0–3)
EOSINOPHIL #: 0.2 10*3/uL (ref 0.00–0.80)
EOSINOPHIL %: 3 % (ref 0–7)
HCT: 37.2 % (ref 37.0–47.0)
HGB: 12.5 g/dL (ref 12.5–16.0)
LYMPHOCYTE #: 1.7 10*3/uL (ref 1.10–5.00)
LYMPHOCYTE %: 21 % — ABNORMAL LOW (ref 25–45)
MCH: 29.2 pg (ref 27.0–32.0)
MCHC: 33.6 g/dL (ref 32.0–36.0)
MCV: 86.8 fL (ref 78.0–99.0)
MONOCYTE #: 0.4 10*3/uL (ref 0.00–1.30)
MONOCYTE %: 5 % (ref 0–12)
MPV: 9.2 fL (ref 7.4–10.4)
NEUTROPHIL #: 5.9 10*3/uL (ref 1.80–8.40)
NEUTROPHIL %: 71 % (ref 40–76)
PLATELETS: 260 10*3/uL (ref 140–440)
RBC: 4.28 10*6/uL (ref 4.20–5.40)
RDW: 14.1 % (ref 11.6–14.8)
WBC: 8.2 10*3/uL (ref 4.0–10.5)
WBCS UNCORRECTED: 8.2 10*3/uL

## 2022-07-18 LAB — BASIC METABOLIC PANEL
ANION GAP: 5 mmol/L (ref 4–13)
BUN/CREA RATIO: 14 (ref 6–22)
BUN: 12 mg/dL (ref 7–25)
CALCIUM: 9.1 mg/dL (ref 8.6–10.3)
CHLORIDE: 108 mmol/L — ABNORMAL HIGH (ref 98–107)
CO2 TOTAL: 29 mmol/L (ref 21–31)
CREATININE: 0.87 mg/dL (ref 0.60–1.30)
ESTIMATED GFR: 83 mL/min/{1.73_m2} (ref >59)
GLUCOSE: 99 mg/dL (ref 74–109)
OSMOLALITY, CALCULATED: 283 mOsm/kg (ref 270–290)
POTASSIUM: 3.8 mmol/L (ref 3.5–5.1)
SODIUM: 142 mmol/L (ref 136–145)

## 2022-07-18 LAB — URINALYSIS, MICROSCOPIC
RBCS: 3 /hpf (ref <4)
SQUAMOUS EPITHELIAL: 3 /hpf (ref <28)
WBCS: 1 /hpf (ref <6)

## 2022-07-18 LAB — CREATINE KINASE (CK), TOTAL, SERUM: CREATINE KINASE: 21 U/L — ABNORMAL LOW (ref 30–223)

## 2022-07-18 NOTE — Telephone Encounter (Signed)
Pt with generalized pain/joint pain "All over."  She denies symptoms of infection.  Would not expect this to be MS flare/relapse, but check CK and pseudo relapse labs.  Consider moving MRI's up.

## 2022-07-19 ENCOUNTER — Encounter (INDEPENDENT_AMBULATORY_CARE_PROVIDER_SITE_OTHER): Payer: Self-pay | Admitting: Nurse Practitioner

## 2022-07-21 ENCOUNTER — Other Ambulatory Visit (HOSPITAL_COMMUNITY): Payer: Self-pay

## 2022-07-21 ENCOUNTER — Other Ambulatory Visit: Payer: Self-pay

## 2022-07-21 ENCOUNTER — Inpatient Hospital Stay
Admission: RE | Admit: 2022-07-21 | Discharge: 2022-07-21 | Disposition: A | Discharge status: Home / Self Care | Payer: 59 | Source: Ambulatory Visit

## 2022-07-21 DIAGNOSIS — M25521 Pain in right elbow: Secondary | ICD-10-CM

## 2022-08-25 ENCOUNTER — Encounter (INDEPENDENT_AMBULATORY_CARE_PROVIDER_SITE_OTHER): Payer: Self-pay | Admitting: Nurse Practitioner

## 2022-08-29 ENCOUNTER — Encounter (INDEPENDENT_AMBULATORY_CARE_PROVIDER_SITE_OTHER): Payer: Self-pay | Admitting: Nurse Practitioner

## 2022-08-29 ENCOUNTER — Other Ambulatory Visit: Payer: Self-pay

## 2022-08-29 ENCOUNTER — Ambulatory Visit: Payer: 59 | Attending: Nurse Practitioner | Admitting: Nurse Practitioner

## 2022-08-29 VITALS — BP 149/90 | HR 94 | Ht 68.0 in | Wt 186.9 lb

## 2022-08-29 DIAGNOSIS — Z5181 Encounter for therapeutic drug level monitoring: Secondary | ICD-10-CM | POA: Insufficient documentation

## 2022-08-29 DIAGNOSIS — M7918 Myalgia, other site: Secondary | ICD-10-CM

## 2022-08-29 DIAGNOSIS — M2550 Pain in unspecified joint: Secondary | ICD-10-CM | POA: Insufficient documentation

## 2022-08-29 DIAGNOSIS — G35 Multiple sclerosis: Secondary | ICD-10-CM | POA: Insufficient documentation

## 2022-08-29 MED ORDER — MELOXICAM 15 MG TABLET
15.0000 mg | ORAL_TABLET | Freq: Every day | ORAL | 0 refills | Status: DC
Start: 2022-08-29 — End: 2022-09-26

## 2022-08-29 NOTE — Progress Notes (Signed)
[facility]  Multiple Sclerosis and Clinical Neuroimmunology [facility]    Date:  08/29/2022  Name: Lindsey Bass  Age:  46 y.o.  Referring Physician:   No referring provider defined for this encounter.    History of Present Illness:  History obtained from:  patient    ------------------------  Disease categorization: RRMS  Date diagnosed: 2022    Current DMT:  None  Previous DMTs: Ocrevus x 2 cycles.  Last infusion 03/2021  Steroid history: Received in past with benefit    Neurologic history:  -Has had chronic leg pain for years.  -February 2022 developed ascending paresthesias. MRI at outside facility with spine lesions. Minimal amount of brain lesions. In hindsight did have one episode 23 years ago in which she developed numbness in left arm and right leg after giving birth to a child, which resolved with PT. Got initial course of Ocrevus in May 2022 but did not get subsequent doses due to diagnostic uncertainty after evaluation here. Has been clinically stable since last year.    Pertinent studies:  -MRI brain/C/T spine 04/2022: I reviewed in her clinic visit and felt like her lesions were overall nonspecific. Radiology report resulted as follows: 1.Minimal juxtacortical and periventricular white matter lesions are unchanged from 03/01/2021 and likely reflect a demyelinating plaques of multiple sclerosis. 2.Unchanged short segment cord lesions at C2, C4-C5 and C6-C7 consistent with demyelinating plaques of multiple sclerosis. 3.No evidence of demyelinating disease in the thoracic spine. *Dr. Leonides Schanz personally reviewed her MRIs. "She has a very low lesion burden - but in re-review after radiology report I do agree that there are a few very small periventricular lesions."  Offered DMT, pt declined at this time.    ------------------------    Lindsey Bass is a 46 y.o. female who presents with a chief concern of   Chief Complaint   Patient presents with    Multiple Sclerosis      Since Last Visit:  -Sporadic  joint swelling, and pain.   - Started in late August with swelling of R elbow.  Was seen in urgent care for erythema and swelling, however, she reports work up was negative.   - Twice since that incident L knee has become swollen.    - Reports she feels like her joints hurt and her muscles ache "all the time."   - CK with first episode of elbow swelling was low.   - All pain feels more "joint or muscle related."  The patient denies any burning/tingling/numbness or new weakness.   - No fever, cough, dysuria, congestion at any time with other symptoms.    - Did not get MRIs ordered with MyChart messages.    - Pt did notice swelling in her ankles one day, but it was after she had been on her feet all day - works two jobs (school and food truck)  - Takes ibuprofen for pain relief.    -On Ocrevus.      Past Medical History:   Diagnosis Date    Hypothyroidism     Oral lesion     PONV (postoperative nausea and vomiting)     Thyroid disorder     Wears glasses      Past Surgical History:   Procedure Laterality Date    EYE SURGERY      1980's 3 surgeries    HX OTHER  2000    exploratory lap after bleeding from tubal surgery    HX TUBAL LIGATION  2000  MOUTH SURGERY  02/2018    TUMOR EXCISION Right 1986    right leg     Current Outpatient Medications   Medication Sig    cyanocobalamin (VITAMIN B12) 1,000 mcg/mL Injection Solution 1 mL (1,000 mcg total) Every 30 days    levothyroxine (SYNTHROID) 112 mcg Oral Tablet Take 1 Tablet (112 mcg total) by mouth Every morning    meloxicam (MOBIC) 15 mg Oral Tablet Take 1 Tablet (15 mg total) by mouth Once a day    tetrahydrozoline/polyethyl gly (EYE DROPS OPHT) Administer into affected eye(s)     No Known Allergies  Family Medical History:       Problem Relation (Age of Onset)    Breast Cancer Mother (23), Maternal Grandmother    Migraines Father          Social History     Socioeconomic History    Marital status: Married     Spouse name: Doctor, general practice    Number of children: 3    Highest  education level: High school graduate   Tobacco Use    Smoking status: Never    Smokeless tobacco: Never   Vaping Use    Vaping Use: Never used   Substance and Sexual Activity    Alcohol use: Never    Drug use: Never   Other Topics Concern    Ability to Walk 1 Flight of Steps without SOB/CP Yes    Routine Exercise Yes     Comment: runs 2-3 times a week 3 miles    Ability to Walk 2 Flight of Steps without SOB/CP Yes    Ability To Do Own ADL's Yes    Other Activity Level Yes     Comment: cleaning, cooking, grocery shopping, mowing , gardening. NO chest or SOB       Review of Systems  Gen: No malaise. ID: No infections. HEENT: No hearing or vision changes. Respiratory: No shortness of breath or cough. Cardiac: No chest pain. GU: No hematuria. GI: No GI bleeding. Extremities: No peripheral edema. Musculoskeletal: No joint pain, swelling. Neuro: As above. Psychiatric: No mood changes.    Examination:    Vitals: BP (!) 149/90   Pulse 94   Ht 1.727 m ('5\' 8"'$ )   Wt 84.8 kg (186 lb 15.2 oz)   SpO2 99%   BMI 28.43 kg/m       General Exam:  General: No acute distress.  HEENT: No scleral icterus.  Extremities: No peripheral edema.  Psychiatric: Affect calm.    Neurologic Exam:  Ophthalmoscopic: Optic discs sharp without papilledema or pallor.  Orientation: Awake, alert, appropriately oriented.  Memory: Recent and remote memory appear intact. Formal memory testing not completed today.  Attention: Normal in conversation.  Knowledge: Appropriate.  Language: Fluent with intact comprehension.  Speech: Normal.  Cranial nerves: Pupils are equal and reactive bilaterally. Extraocular movements are intact. Facial sensation intact. Face activates symmetrically. Hearing intact to finger rub bilaterally. Shoulder shrug 5/5 bilaterally. Tongue and uvula midline.   Sensory: Intact to light touch throughout.  Muscle tone: Normal.    Muscle exam:  Arm Right Left Leg Right Left   Deltoid 5/5 5/5 Iliopsoas 5/5 5/5   Biceps 5/5 5/5 Quads 5/5  5/5   Triceps 5/5 5/5 Hamstrings 5/5 5/5   Interossei 5/5 5/5 Ankle Dorsi Flexion 5/5 5/5   APB 5/5 5/5 Ankle Plantar Flexion 5/5 5/5     Reflexes:   RJ BJ TJ KJ AJ    Right 2+ 2+ 2+  2+ 2+    Left 2+ 2+ 2+ 2+ 2+      Extremities - Full ROM.  No erythema or edema of joints noted today.   Coordination: Finger-nose without dysmetria bilaterally.  Gait: Normal casual gait. Negative Romberg.    Assessment and Plan:    Assessment/Plan   1. Multiple sclerosis (CMS HCC)    2. Arthralgia, unspecified joint    3. Encounter for medication monitoring    RRMS on Ocrevus with reports of sporadic joint swelling/pain and diffuse muscle aches over the last 2 months.  Was seen in urgent care close to her home for first episode of elbow swelling and she reports workup was negative.  CK drawn with initial complaints was low.  However, urgent care check rheumatoid factor, ANA, both negative.   No new numbness/tingling/weakness or vision changes.  Explained to patient that sporadic joint welling, muscle pain would not be typical of MS changes, but should update MRIs.  She agrees.  Await results.    Try Mobic for 30 days, but explained to patient that if MRIs are stable we will need to consider next steps/referral.  She understands.   Significant portion of today's visit was spent counseling/educating patient.  She has been reading on many MS support groups on line about various subscribers symptoms and it is causing her great worry.  She is not developing those symptoms, however, she states "I wonder if that is what I have to look forward to."  Explained to patient that prognosis for MS present is very good for the majority of patient as we have better treatments.  Explained she may never experience a lot of the mentioned symptoms and that some internet info may not even be correct information.  Advised her she can MyChart Dr. Leonides Schanz or myself if she needs clarification.  Try not to read too much.     Orders Placed This Encounter    MRI  BRAIN W/WO CONTRAST    MRI SPINE CERVICAL W/WO CONTRAST    meloxicam (MOBIC) 15 mg Oral Tablet     No follow-ups on file.     On the day of the encounter, a total of 45 minutes was spent on patient encounter including review of historical information, examination, documentation, and post visit activities.     Rozell Searing, APRN 08/29/2022  [facility] Provider, Department of Neurology

## 2022-09-05 ENCOUNTER — Encounter (INDEPENDENT_AMBULATORY_CARE_PROVIDER_SITE_OTHER): Payer: Self-pay | Admitting: Nurse Practitioner

## 2022-09-16 ENCOUNTER — Ambulatory Visit (HOSPITAL_COMMUNITY): Payer: Self-pay | Admitting: Radiology

## 2022-09-25 ENCOUNTER — Other Ambulatory Visit (INDEPENDENT_AMBULATORY_CARE_PROVIDER_SITE_OTHER): Payer: Self-pay | Admitting: Nurse Practitioner

## 2022-10-10 ENCOUNTER — Inpatient Hospital Stay
Admission: RE | Admit: 2022-10-10 | Discharge: 2022-10-10 | Disposition: A | Discharge status: Home / Self Care | Payer: 59 | Source: Ambulatory Visit | Attending: Neurology | Admitting: Neurology

## 2022-10-10 ENCOUNTER — Other Ambulatory Visit: Payer: Self-pay

## 2022-10-10 ENCOUNTER — Inpatient Hospital Stay (HOSPITAL_BASED_OUTPATIENT_CLINIC_OR_DEPARTMENT_OTHER)
Admission: RE | Admit: 2022-10-10 | Discharge: 2022-10-10 | Disposition: A | Discharge status: Home / Self Care | Payer: 59 | Source: Ambulatory Visit

## 2022-10-10 DIAGNOSIS — G3789 Other specified demyelinating diseases of central nervous system: Secondary | ICD-10-CM

## 2022-10-10 DIAGNOSIS — R9389 Abnormal findings on diagnostic imaging of other specified body structures: Secondary | ICD-10-CM | POA: Insufficient documentation

## 2022-10-10 DIAGNOSIS — G9589 Other specified diseases of spinal cord: Secondary | ICD-10-CM

## 2022-10-10 MED ORDER — GADOBUTROL 10 MMOL/10 ML (1 MMOL/ML) INTRAVENOUS SOLUTION
8.0000 mL | INTRAVENOUS | Status: AC
Start: 2022-10-10 — End: 2022-10-10
  Administered 2022-10-10: 8 mL via INTRAVENOUS

## 2022-10-12 ENCOUNTER — Encounter (INDEPENDENT_AMBULATORY_CARE_PROVIDER_SITE_OTHER): Payer: Self-pay | Admitting: Neurology

## 2022-10-28 ENCOUNTER — Ambulatory Visit: Payer: 59 | Attending: Nurse Practitioner | Admitting: Nurse Practitioner

## 2022-10-28 ENCOUNTER — Encounter (INDEPENDENT_AMBULATORY_CARE_PROVIDER_SITE_OTHER): Payer: Self-pay | Admitting: Nurse Practitioner

## 2022-10-28 DIAGNOSIS — G35 Multiple sclerosis: Secondary | ICD-10-CM | POA: Insufficient documentation

## 2022-10-28 DIAGNOSIS — M2550 Pain in unspecified joint: Secondary | ICD-10-CM | POA: Insufficient documentation

## 2022-10-28 DIAGNOSIS — M2540 Effusion, unspecified joint: Secondary | ICD-10-CM | POA: Insufficient documentation

## 2022-10-28 NOTE — Progress Notes (Addendum)
TELEMEDICINE DOCUMENTATION:    Patient Location:  MyChart video visit from home address: [address]  [place] [id number]    Patient/family aware of provider location:  yes  Patient/family consent for telemedicine:  yes  Examination observed and performed by:  Rozell Searing, [facility]  Multiple Sclerosis and Clinical Neuroimmunology [facility]    Date:  10/28/2022  Name: Lindsey Bass  Age:  46 y.o.  Referring Physician:   No referring provider defined for this encounter.    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     Neurologic history:  -Has had chronic leg pain for years.  -February 2022 developed ascending paresthesias. MRI at outside facility with spine lesions. Minimal amount of brain lesions. In hindsight did have one episode 23 years ago in which she developed numbness in left arm and right leg after giving birth to a child, which resolved with PT. Got initial course of Ocrevus in May 2022 but did not get subsequent doses due to diagnostic uncertainty after evaluation here. Has been clinically stable since last year.    Pertinent studies:  -MRI brain/C/T spine 04/2022: Dr. Leonides Schanz reviewed during her clinic visit with pt and felt like her lesions were overall nonspecific. Radiology report resulted as follows: 1.Minimal juxtacortical and periventricular white matter lesions are unchanged from 03/01/2021 and likely reflect a demyelinating plaques of multiple sclerosis. 2.Unchanged short segment cord lesions at C2, C4-C5 and C6-C7 consistent with demyelinating plaques of multiple sclerosis. 3.No evidence of demyelinating disease in the thoracic spine. Again, Dr. Leonides Schanz reviewed her MRIs. "She has a very low lesion burden - but in re-review after radiology report I do agree that  there are a few very small periventricular lesions."  -*MRI Brain 10/10/2022 - Stable demyelinating lesions within the brain. No definite new lesion. No enhancing lesion to suggest active demyelination   -*MRI C spine 10/10/2022 - Stable T2 hyperintense lesions in the cervical spinal cord. No new lesion. No enhancing lesion to suggest active demyelination.   ------------------------    Lindsey Bass is a 46 y.o. female who presents with a chief concern of No chief complaint on file.     Since Last Visit:  - Pt continues to have sporadic joint pain in her elbows, knees, and jaw.    - At one point, had redness and swelling of R elbow - xray was neg.   - Patient had CK drawn - Result low at 21.   - Patient also went to clinic closer to home to have Rheumatoid factor, Sed rate, and C reactive protein drawn.  The patient reports this was all normal.  She is going to send [place] those results in [place].   - She reports since our last discussion she has not had much swelling, but still joint pain.  "Some days it's a 1/10 and some days it's a 10/10."    - Working two jobs - on her feet a lot.   - Meloxicam helps "sometimes."   - Recent MRIs stable - low disease burden in relation to MS on MRIs.   - No new numbness/weakness or tingling.  No vision changes.   - Not on DMT.   - Had chronic leg pain years ago.  Past Medical History:   Diagnosis Date    Hypothyroidism     Oral lesion     PONV (postoperative nausea and vomiting)     Thyroid disorder     Wears glasses      Past Surgical History:   Procedure Laterality Date    EYE SURGERY      1980's 3 surgeries    HX OTHER  2000    exploratory lap after bleeding from tubal surgery    HX TUBAL LIGATION  2000    MOUTH SURGERY  02/2018    TUMOR EXCISION Right 1986    right leg     Current Outpatient Medications   Medication Sig    cyanocobalamin (VITAMIN B12) 1,000 mcg/mL Injection Solution 1 mL (1,000 mcg total) Every 30 days    levothyroxine (SYNTHROID) 112 mcg Oral Tablet Take 1  Tablet (112 mcg total) by mouth Every morning    meloxicam (MOBIC) 15 mg Oral Tablet TAKE 1 TABLET (15 MG TOTAL) BY MOUTH EVERY DAY    tetrahydrozoline/polyethyl gly (EYE DROPS OPHT) Administer into affected eye(s)     No Known Allergies  Family Medical History:       Problem Relation (Age of Onset)    Breast Cancer Mother (25), Maternal Grandmother    Migraines Father          Social History     Socioeconomic History    Marital status: Married     Spouse name: Doctor, general practice    Number of children: 3    Highest education level: High school graduate   Tobacco Use    Smoking status: Never    Smokeless tobacco: Never   Vaping Use    Vaping Use: Never used   Substance and Sexual Activity    Alcohol use: Never    Drug use: Never   Other Topics Concern    Ability to Walk 1 Flight of Steps without SOB/CP Yes    Routine Exercise Yes     Comment: runs 2-3 times a week 3 miles    Ability to Walk 2 Flight of Steps without SOB/CP Yes    Ability To Do Own ADL's Yes    Other Activity Level Yes     Comment: cleaning, cooking, grocery shopping, mowing , gardening. NO chest or SOB       Review of Systems  Gen: No malaise. ID: No infections. HEENT: No hearing or vision changes. Respiratory: No shortness of breath or cough. Cardiac: No chest pain. GU: No hematuria. GI: No GI bleeding. Extremities: No peripheral edema. Musculoskeletal: + joint pain, + swelling. Neuro: As above. Psychiatric: No mood changes.    Examination:    Vitals: There were no vitals taken for this visit. - Telemedicine Visit.    General Exam:  General: No acute distress.  HEENT: No scleral icterus.  Extremities: Full ROM as visualized over video.   Psychiatric: Affect calm.    Neurologic Exam:  Orientation: Awake, alert, appropriately oriented.  Memory: Recent and remote memory appear intact. Formal memory testing not completed today.  Attention: Normal in conversation.  Knowledge: Appropriate.  Language: Fluent with intact comprehension.  Speech: Normal.  Cranial nerves:  Extraocular movements are intact.Face activates symmetrically. Hearing intact over video.   Sensory: Unable to assess - video visit  Muscle tone: Normal.  Coordination: Normal with walking over video   Gait: Normal casual gait    Assessment and Plan:    Assessment/Plan   1. Multiple sclerosis (CMS HCC)  2. Arthralgia, unspecified joint    3. 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  Currently on no DMT.  Most recent MRIs stable.  However, patient continues over the last few months to have sporadic joint pain in her knees, shoulders, elbows, jaw.  In past it was occasionally accompanied by swelling, but not so much recently.  Meloxicam helps occasionally.  CK was low on lab work.  Pt reports follow up lab work at clinic near her home which included Rheumatoid factor, Sed rate, and C reactive protein was normal.  Asked patient to send [place] these results.    Discussed with patient that joint pain/swelling would not be typical of morphine sulphate-MS.  With questioning, her pain does not have neuropathic characteristics.    Advised that I would recommend referral to rheumatology.   She agrees.  Referral placed.    RTC 6 months - call sooner with changes/concerns.     Orders Placed This Encounter    Referral to Rheumatology       Return in about 6 months (around 04/29/2023).     On the day of the encounter, a total of 39 minutes was spent on patient encounter including review of historical information, examination, and planning.     Rozell Searing, APRN 10/28/2022  [facility] Provider, Department of Neurology

## 2023-01-11 NOTE — H&P (Signed)
RHEUMATOLOGY CLINIC  SECTION OF RHEUMATOLOGY  DEPARTMENT OF MEDICINE  [facility] MEDICINE    NEW PATIENT VISIT    PATIENT NAME:  Lindsey Bass  DATE OF BIRTH:  09-17-1976  MRN:  [id number]  DATE OF SERVICE:  01/12/2023    HISTORY OF PRESENT ILLNESS:   Lindsey Bass is a 47 y.o. female presenting to clinic today for evaluation of joint swelling and arthralgia referred by Rozell Searing, A*. Patient presents to clinic today with her husband, Elta [place].     Cc: joint swelling and arthralgia     HPI: Patient is a 47 y.o. woman with a history of joint pain. Her neurologist ruled. Her pain has not been as bad the past couple of months. In the summer her elbows swelled up with red and warmth and then had migrated to other areas. She did travel to [place] that summer. She started steroids and this went away. A couple of weeks ago, her R elbow had swelled with a sac and she had fluid drained from this and was told this was bursitis with no crystal formations. Her shoulders, knees, and elbows have been bothering her. Her legs had swelled occasionally when she is working for a long time. Her knees do not bother her much. She also had an episode where her jaw hurt her one night so bad that she could not move it. She is not sure if she was ever tested for Lyme. ~1 year ago she felt fine. NO oral ulcers, eye symptoms, worsening hair loss, swollen lymph nodes, SOB, chest pain, photosensitivity, dry eyes/mouth. A couple nights ago her tongue had ridges down both sides. She took one round of medicine for her Multiple Sclerosis but is not currently medicated. Has never been told what stage and currently she is having no issues at this point. She did have a rash where her whole back broke out that looked like a heat rash that happened in the summer but had never experienced this previously. No weakness. Her legs do hurt a lot but she has experienced this all her life, but does feel that they are hurting more often. She is on her feet  daily but does not do much exercise. Does have trouble sleeping.       REVIEW OF SYSTEMS:    Medications and Allergies reviewed in EPIC.    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 Smoked when she was younger but nothing consistent and rare alcohol use. No other drug use. Lives in [place] with her husband.     Family History:  Cousin: RA   No other known autoimmune diseases or gout    PHYSICAL EXAM:  BP 124/78   Pulse (!) 120   Ht 1.727 m ('5\' 8"'$ )   Wt 82.9 kg (182 lb 12.2 oz)   SpO2 97%   BMI 27.79 kg/m     Constitutional:  No acute distress.    Skin: no significant rashes. Diffuse pink.   Mouth/Throat:  Oropharynx is clear and moist. No lesions, no ulcers, no thrush   Eyes:  No scleral icterus. Conjunctiva not injected.  Neck:  Supple.      Cardiovascular:  Normal rate.  Regular rhythm.  No murmur.  No rub or gallop.  Intact distal pulses.    Pulmonary:  No respiratory distress.  Breath  sounds normal.  No wheezes or rales.    Abdominal:  No distention.  No tenderness.  No rebound or guarding.   Lymphatic:  No cervical adenopathy.    Neurological:  Alert and oriented.  Cranial nerves grossly intact.  Muscle tone normal.  Sensation grossly intact in all extremities.       Musculoskeletal:  Normal gait.  Able to get on and off exam table without assistance.  Full preserved ROM without any tenderness or synovitis at all small and large joints in both upper and lower extremities. No effusion or bursitis of L elbow. Slight crepitus of bilateral knees.       LABS/IMAGING REVIEWED:   No visits with results within 3 Month(s) from this visit.   Latest known visit with results is:   Appointment on 07/18/2022   Component Date Value Ref Range Status    SODIUM 07/18/2022 142  136 - 145 mmol/L Final    POTASSIUM 07/18/2022 3.8  3.5 - 5.1 mmol/L Final    CHLORIDE  07/18/2022 108 (H)  98 - 107 mmol/L Final    CO2 TOTAL 07/18/2022 29  21 - 31 mmol/L Final    ANION GAP 07/18/2022 5  4 - 13 mmol/L Final    CALCIUM 07/18/2022 9.1  8.6 - 10.3 mg/dL Final    GLUCOSE 07/18/2022 99  74 - 109 mg/dL Final    BUN 07/18/2022 12  7 - 25 mg/dL Final    CREATININE 07/18/2022 0.87  0.60 - 1.30 mg/dL Final    BUN/CREA RATIO 07/18/2022 14  6 - 22 Final    ESTIMATED GFR 07/18/2022 83  >59 mL/min/1.79m2 Final    OSMOLALITY, CALCULATED 07/18/2022 283  270 - 290 mOsm/kg Final    CREATINE KINASE 07/18/2022 21 (L)  30 - 223 U/L Final    WBCS UNCORRECTED 07/18/2022 8.2  x10^3/uL Final    WBC 07/18/2022 8.2  4.0 - 10.5 x10^3/uL Final    RBC 07/18/2022 4.28  4.20 - 5.40 x10^6/uL Final    HGB 07/18/2022 12.5  12.5 - 16.0 g/dL Final    HCT 07/18/2022 37.2  37.0 - 47.0 % Final    MCV 07/18/2022 86.8  78.0 - 99.0 fL Final    MCH 07/18/2022 29.2  27.0 - 32.0 pg Final    MCHC 07/18/2022 33.6  32.0 - 36.0 g/dL Final    RDW 07/18/2022 14.1  11.6 - 14.8 % Final    PLATELETS 07/18/2022 260  140 - 440 x10^3/uL Final    MPV 07/18/2022 9.2  7.4 - 10.4 fL Final    NEUTROPHIL % 07/18/2022 71  40 - 76 % Final    LYMPHOCYTE % 07/18/2022 21 (L)  25 - 45 % Final    MONOCYTE % 07/18/2022 5  0 - 12 % Final    EOSINOPHIL % 07/18/2022 3  0 - 7 % Final    BASOPHIL % 07/18/2022 1  0 - 3 % Final    NEUTROPHIL # 07/18/2022 5.90  1.80 - 8.40 x10^3/uL Final    LYMPHOCYTE # 07/18/2022 1.70  1.10 - 5.00 x10^3/uL Final    MONOCYTE # 07/18/2022 0.40  0.00 - 1.30 x10^3/uL Final    EOSINOPHIL # 07/18/2022 0.20  0.00 - 0.80 x10^3/uL Final    BASOPHIL # 07/18/2022 0.00  0.00 - 0.30 x10^3/uL Final    COLOR 07/18/2022 Light Yellow  Colorless, Light Yellow, Yellow Final    APPEARANCE 07/18/2022 Clear  Clear Final    SPECIFIC GRAVITY 07/18/2022 1.026  1.002 - 1.030 Final    PH 07/18/2022 5.5  5.0 - 9.0 Final    LEUKOCYTES 07/18/2022 Negative  Negative, 100  WBCs/uL Final    NITRITE 07/18/2022 Negative  Negative Final    PROTEIN 07/18/2022  Negative  Negative, 10 , 20  mg/dL Final    GLUCOSE 07/18/2022 Negative  Negative, 30  mg/dL Final    KETONES 07/18/2022 Negative  Negative, Trace mg/dL Final    BILIRUBIN 07/18/2022 Negative  Negative, 0.5 mg/dL Final    BLOOD 07/18/2022 0.1 (A)  Negative, 0.03 mg/dL Final    UROBILINOGEN 07/18/2022 Normal  Normal mg/dL Final    MUCOUS 07/18/2022 Rare (A)  (none) /hpf Final    RBCS 07/18/2022 3  <4 /hpf Final    WBCS 07/18/2022 1  <6 /hpf Final    SQUAMOUS EPITHELIAL 07/18/2022 3  <28 /hpf Final   Labs   02/2021:  RNP negative   Anti-smith negative   RF negative   SSA & SSB negative   Ani-chromatin negative  Hemoglobin 13   Platelets 257   Diff was normal   Elevated neutrophils   Monocytes were slightly elevated.   Hepatitis serologies were negative.     07/2022:   Creatinine 0.8   Protein 6.7   AST 18   ALT 16   Uric acid 5.6   ASIL >20   ANA negative  CRP <2      ASSESSMENT & PLAN:   - Update labs today as listed below.   - Advised patient to send our office a copy of the fluid documentation.         Follow up in clinic as needed.       TODAY'S ORDERS:  Orders Placed This Encounter    URIC ACID    CBC/DIFF    COMPREHENSIVE METABOLIC PANEL, NON-FASTING    C3 COMPLEMENT, SERUM    C4 COMPLEMENT, SERUM    LYME ANTIBODY PANEL WITH REFLEX    CYCLIC CITRULLINATED PEPTIDE ANTIBODIES, IGG, SERUM    SEDIMENTATION RATE    C-REACTIVE PROTEIN(CRP),INFLAMMATION    Ferritin       Thank you for the consult for this very nice patient. Please do not hesitate to reach out if you have any questions.    I am scribing for, and in the presence of, Dr. Gillermina Hu, MD, MPH, for services provided on 01/12/2023.  // Graciela Husbands, SCRIBE 01/12/2023, 16:36

## 2023-01-12 ENCOUNTER — Other Ambulatory Visit: Payer: Self-pay

## 2023-01-12 ENCOUNTER — Ambulatory Visit: Payer: 59 | Attending: Internal Medicine | Admitting: Internal Medicine

## 2023-01-12 DIAGNOSIS — M2540 Effusion, unspecified joint: Secondary | ICD-10-CM | POA: Insufficient documentation

## 2023-01-12 DIAGNOSIS — M2550 Pain in unspecified joint: Secondary | ICD-10-CM | POA: Insufficient documentation

## 2023-01-12 NOTE — Patient Instructions (Addendum)
-   Update labs today as listed below.   - Advised patient to send our office a copy of the fluid documentation.

## 2023-01-15 ENCOUNTER — Other Ambulatory Visit: Payer: Self-pay

## 2023-01-15 ENCOUNTER — Other Ambulatory Visit: Payer: 59 | Attending: Internal Medicine

## 2023-01-15 DIAGNOSIS — M2550 Pain in unspecified joint: Secondary | ICD-10-CM | POA: Insufficient documentation

## 2023-01-15 DIAGNOSIS — M2540 Effusion, unspecified joint: Secondary | ICD-10-CM | POA: Insufficient documentation

## 2023-01-15 LAB — COMPREHENSIVE METABOLIC PANEL, NON-FASTING
ALBUMIN/GLOBULIN RATIO: 1.5 — ABNORMAL HIGH (ref 0.8–1.4)
ALBUMIN: 4.3 g/dL (ref 3.5–5.7)
ALKALINE PHOSPHATASE: 55 U/L (ref 34–104)
ALT (SGPT): 12 U/L (ref 7–52)
ANION GAP: 5 mmol/L (ref 4–13)
AST (SGOT): 14 U/L (ref 13–39)
BILIRUBIN TOTAL: 0.5 mg/dL (ref 0.3–1.2)
BUN/CREA RATIO: 15 (ref 6–22)
BUN: 13 mg/dL (ref 7–25)
CALCIUM, CORRECTED: 9.2 mg/dL (ref 8.9–10.8)
CALCIUM: 9.4 mg/dL (ref 8.6–10.3)
CHLORIDE: 106 mmol/L (ref 98–107)
CO2 TOTAL: 28 mmol/L (ref 21–31)
CREATININE: 0.84 mg/dL (ref 0.60–1.30)
ESTIMATED GFR: 87 mL/min/{1.73_m2} (ref >59)
GLOBULIN: 2.9 (ref 2.9–5.4)
GLUCOSE: 83 mg/dL (ref 74–109)
OSMOLALITY, CALCULATED: 277 mOsm/kg (ref 270–290)
POTASSIUM: 4.1 mmol/L (ref 3.5–5.1)
PROTEIN TOTAL: 7.2 g/dL (ref 6.4–8.9)
SODIUM: 139 mmol/L (ref 136–145)

## 2023-01-15 LAB — CBC WITH DIFF
BASOPHIL #: 0 10*3/uL (ref 0.00–0.10)
BASOPHIL %: 0 % (ref 0–1)
EOSINOPHIL #: 0.1 10*3/uL (ref 0.00–0.50)
EOSINOPHIL %: 1 %
HCT: 42 % — ABNORMAL HIGH (ref 31.2–41.9)
HGB: 14 g/dL (ref 10.9–14.3)
LYMPHOCYTE #: 1.4 10*3/uL (ref 1.00–3.00)
LYMPHOCYTE %: 18 % (ref 16–44)
MCH: 29.4 pg (ref 24.7–32.8)
MCHC: 33.4 g/dL (ref 32.3–35.6)
MCV: 87.8 fL (ref 75.5–95.3)
MONOCYTE #: 0.5 10*3/uL (ref 0.30–1.00)
MONOCYTE %: 7 % (ref 5–13)
MPV: 9.2 fL (ref 7.9–10.8)
NEUTROPHIL #: 5.9 10*3/uL (ref 1.85–7.80)
NEUTROPHIL %: 74 % (ref 43–77)
PLATELETS: 242 10*3/uL (ref 140–440)
RBC: 4.78 10*6/uL (ref 3.63–4.92)
RDW: 15 % (ref 12.3–17.7)
WBC: 7.9 10*3/uL (ref 3.8–11.8)

## 2023-01-15 LAB — URIC ACID: URIC ACID: 6.4 mg/dL (ref 2.3–7.6)

## 2023-01-15 LAB — SEDIMENTATION RATE: ERYTHROCYTE SEDIMENTATION RATE (ESR): 8 mm/hr (ref <20)

## 2023-01-15 LAB — FERRITIN: FERRITIN: 14 ng/mL (ref 11–336)

## 2023-01-15 LAB — C-REACTIVE PROTEIN(CRP),INFLAMMATION: C-REACTIVE PROTEIN (CRP): 0.9 mg/dL — ABNORMAL HIGH (ref 0.1–0.5)

## 2023-01-17 ENCOUNTER — Other Ambulatory Visit (HOSPITAL_BASED_OUTPATIENT_CLINIC_OR_DEPARTMENT_OTHER): Payer: Self-pay | Admitting: Internal Medicine

## 2023-01-17 LAB — LYME ANTIBODY PANEL WITH REFLEX: LYME ANTIBODY TOTAL (Screen): POSITIVE — AB

## 2023-01-17 LAB — LYME DISEASE ANTIBODY, IGG: LYME ANTIBODY IGG (Confirmation): POSITIVE — AB

## 2023-01-17 LAB — CYCLIC CITRULLINATED PEPTIDE ANTIBODIES, IGG, SERUM
CYCLIC CITRULLINATED PEPTIDE ANTIBODY IGG QUAL: NEGATIVE
CYCLIC CITRULLINATED PEPTIDE ANTIBODY IGG QUANT: <0.5 U/mL (ref <3.0)

## 2023-01-17 LAB — C3 COMPLEMENT, SERUM: C3 COMPLEMENT: 130 mg/dL (ref 81–157)

## 2023-01-17 LAB — LYME DISEASE ANTIBODY, IGM: LYME ANTIBODY IGM (Confirmation): NEGATIVE

## 2023-01-17 LAB — C4 COMPLEMENT, SERUM: C4 COMPLEMENT: 22 mg/dL (ref 12–39)

## 2023-01-17 MED ORDER — DOXYCYCLINE HYCLATE 100 MG CAPSULE
100.0000 mg | ORAL_CAPSULE | Freq: Two times a day (BID) | ORAL | 0 refills | Status: DC
Start: 2023-01-17 — End: 2023-11-20

## 2023-01-24 ENCOUNTER — Encounter (INDEPENDENT_AMBULATORY_CARE_PROVIDER_SITE_OTHER): Payer: Self-pay | Admitting: Nurse Practitioner

## 2023-02-07 ENCOUNTER — Ambulatory Visit (HOSPITAL_BASED_OUTPATIENT_CLINIC_OR_DEPARTMENT_OTHER): Payer: Self-pay | Admitting: INTERNAL MEDICINE

## 2023-03-09 ENCOUNTER — Encounter (INDEPENDENT_AMBULATORY_CARE_PROVIDER_SITE_OTHER): Payer: Self-pay | Admitting: Neurology

## 2023-09-27 ENCOUNTER — Encounter (INDEPENDENT_AMBULATORY_CARE_PROVIDER_SITE_OTHER): Payer: Self-pay | Admitting: Neurology

## 2023-11-20 ENCOUNTER — Other Ambulatory Visit (INDEPENDENT_AMBULATORY_CARE_PROVIDER_SITE_OTHER): Payer: 59 | Admitting: Rheumatology

## 2023-11-20 ENCOUNTER — Ambulatory Visit: Payer: 59 | Attending: Neurology | Admitting: Neurology

## 2023-11-20 ENCOUNTER — Other Ambulatory Visit: Payer: Self-pay

## 2023-11-20 ENCOUNTER — Encounter (INDEPENDENT_AMBULATORY_CARE_PROVIDER_SITE_OTHER): Payer: Self-pay | Admitting: Neurology

## 2023-11-20 VITALS — BP 147/88 | HR 86 | Ht 68.0 in | Wt 179.2 lb

## 2023-11-20 DIAGNOSIS — R200 Anesthesia of skin: Secondary | ICD-10-CM | POA: Insufficient documentation

## 2023-11-20 DIAGNOSIS — G35 Multiple sclerosis: Secondary | ICD-10-CM | POA: Insufficient documentation

## 2023-11-20 LAB — THYROID STIMULATING HORMONE WITH FREE T4 REFLEX: TSH: 4.829 u[IU]/mL (ref 0.350–4.940)

## 2023-11-20 LAB — VITAMIN B12: VITAMIN B 12: 842 pg/mL (ref 200–900)

## 2023-11-20 NOTE — Progress Notes (Signed)
[facility]  Multiple Sclerosis [facility]      Date: 11/20/2023  Name: Lindsey Bass  Age:  47 y.o.  Referring Physician:   Adaline Sill, MD  [address]  [place],  [place] [address]    History of Present Illness:  History obtained from:  patient    ------------------------  Disease categorization: RRMS  Date diagnosed: 2022    Current DMT:  None  Previous DMTs: Ocrevus x 2 cycles.  Last infusion 03/2021  Steroid history: Received in past with benefit    Neurologic history:  -Has had chronic leg pain for years.  -February 2022 developed ascending paresthesias. MRI at outside facility with spine lesions. Minimal amount of brain lesions. In hindsight did have one episode 23 years ago in which she developed numbness in left arm and right leg after giving birth to a child, which resolved with PT. Got initial course of Ocrevus in May 2022 but did not get subsequent doses due to diagnostic uncertainty after evaluation here. Has been clinically stable since last year.    Pertinent studies:  -MRI brain/C/T spine 04/2022: I reviewed in her clinic visit and felt like her lesions were overall nonspecific. Radiology report resulted as follows: 1.Minimal juxtacortical and periventricular white matter lesions are unchanged from 03/01/2021 and likely reflect a demyelinating plaques of multiple sclerosis. 2.Unchanged short segment cord lesions at C2, C4-C5 and C6-C7 consistent with demyelinating plaques of multiple sclerosis. 3.No evidence of demyelinating disease in the thoracic spine. *Dr. Elesa Massed personally reviewed her MRIs. "She has a very low lesion burden - but in re-review after radiology report I do agree that there are a few very small periventricular lesions."  Offered DMT, pt declined at this time.    ------------------------    Lindsey Bass is a 47 y.o. female who returns in follow up for   Chief Complaint   Patient presents with    Multiple Sclerosis    Follow Up      Since  last visit:  -Feeling so/so  -Has had intermittent tongue burning   -Is taking B12  -Legs are acting up again, not as bad as originally but right leg feels like it's trying to go to sleep  -Worse if she is on her legs a lot  -Whole right leg  -Started worsening within the past month or two  -No other new/worsening symptoms  -No new scans since last visit  -Previously some of her symptoms improved with Lyme treatment    Past Medical History:   Diagnosis Date    Hypothyroidism     Oral lesion     PONV (postoperative nausea and vomiting)     Thyroid disorder     Wears glasses          Past Surgical History:   Procedure Laterality Date    EYE SURGERY      1980's 3 surgeries    HX OTHER  2000    exploratory lap after bleeding from tubal surgery    HX TUBAL LIGATION  2000    MOUTH SURGERY  02/2018    TUMOR EXCISION Right 1986    right leg         Current Outpatient Medications   Medication Sig    cyanocobalamin (VITAMIN B12) 1,000 mcg/mL Injection Solution 1 mL (1,000 mcg total) Every 30 days    levothyroxine (SYNTHROID) 112 mcg Oral Tablet Take 1 Tablet (112 mcg total) by mouth Every morning    tetrahydrozoline/polyethyl gly (  EYE DROPS OPHT) Administer into affected eye(s) (Patient not taking: Reported on 11/20/2023)     No Known Allergies  Family Medical History:       Problem Relation (Age of Onset)    Breast Cancer Mother (83), Maternal Grandmother    Migraines Father            Social History     Socioeconomic History    Marital status: Married     Spouse name: Merchant navy officer    Number of children: 3    Highest education level: High school graduate   Tobacco Use    Smoking status: Never    Smokeless tobacco: Never   Vaping Use    Vaping status: Never Used   Substance and Sexual Activity    Alcohol use: Never    Drug use: Never   Other Topics Concern    Ability to Walk 1 Flight of Steps without SOB/CP Yes    Routine Exercise Yes     Comment: runs 2-3 times a week 3 miles    Ability to Walk 2 Flight of Steps without SOB/CP Yes     Ability To Do Own ADL's Yes    Other Activity Level Yes     Comment: cleaning, cooking, grocery shopping, mowing , gardening. NO chest or SOB       Review of Systems:  Other than above, no pertinent positives.    Examination:    Vitals: BP (!) 147/88   Pulse 86   Ht 1.727 m (5\' 8" )   Wt 81.3 kg (179 lb 3.7 oz)   LMP 11/06/2023 (Approximate)   SpO2 94%   BMI 27.25 kg/m       General Exam:  General: No acute distress.  HEENT: No scleral icterus.  Psychiatric: Affect calm.    Neurologic Exam:  Orientation: Awake, alert, appropriately oriented.  Memory: Recent and remote memory appear intact. Formal memory testing not completed today.  Attention: Normal in conversation.  Knowledge: Appropriate.  Language: Fluent with intact comprehension.  Speech: Normal.  Cranial nerves: Extraocular movements are intact. Face symmetric. Hearing intact to conversation.  Muscle tone: Normal.    Muscle exam:  Arm Right Left Leg Right Left   Deltoid 5/5 5/5 Iliopsoas 5/5 5/5   Biceps 5/5 5/5 Quads 5/5 5/5   Triceps 5/5 5/5 Hamstrings 5/5 5/5   Interossei 5/5 5/5 Ankle Dorsi Flexion 5/5 5/5             Reflexes:   RJ BJ TJ KJ     Right 2+ 2+ 2+ 2+     Left 2+ 2+ 2+ 2+       Coordination: Finger-nose without dysmetria bilaterally.  Gait: Normal casual gait.    Assessment and Plan:  Assessment/Plan   1. Numbness    2. Multiple sclerosis (CMS HCC)      Update MRI brain/C spine. Neuropathy labs as below for sensory changes.      Orders Placed This Encounter    MRI BRAIN W/WO CONTRAST    MRI SPINE CERVICAL W/WO CONTRAST    Vitamin B12    THYROID STIMULATING HORMONE WITH FREE T4 REFLEX    METHYLMALONIC ACID (MMA), QUANTITATIVE, PLASMA     [id number]: I and/or [facility] [place] neurology provider will continue to be the provider focal point in managing the chronic complex neurological condition (multiple sclerosis).        Rosalyn Gess, MD 11/20/2023   Associate Professor, Department of Neurology

## 2023-11-20 NOTE — Patient Instructions (Signed)
Magnesium 400 or 500 mg daily may be helpful for your symptoms. It is reasonable to try it for at least one month and if no benefit, you can stop. Occasionally magnesium causes loose stools. Magnesium glycinate may have less GI side effects. If this happens you can stop it.

## 2023-11-21 LAB — METHYLMALONIC ACID (MMA), QUANTITATIVE, PLASMA: METHYLMALONIC ACID: 100 nmol/L (ref 55–335)

## 2024-01-31 ENCOUNTER — Other Ambulatory Visit: Payer: Self-pay

## 2024-01-31 ENCOUNTER — Ambulatory Visit
Admission: RE | Admit: 2024-01-31 | Discharge: 2024-01-31 | Disposition: A | Discharge status: Home / Self Care | Payer: Self-pay | Source: Ambulatory Visit | Attending: Neurology | Admitting: Neurology

## 2024-01-31 ENCOUNTER — Ambulatory Visit (HOSPITAL_BASED_OUTPATIENT_CLINIC_OR_DEPARTMENT_OTHER)
Admission: RE | Admit: 2024-01-31 | Discharge: 2024-01-31 | Disposition: A | Discharge status: Home / Self Care | Payer: Self-pay | Source: Ambulatory Visit

## 2024-01-31 DIAGNOSIS — G35 Multiple sclerosis: Secondary | ICD-10-CM

## 2024-01-31 MED ORDER — GADOPICLENOL 0.5 MMOL/ML INTRAVENOUS SOLUTION
8.0000 mL | INTRAVENOUS | Status: AC
Start: 2024-01-31 — End: 2024-01-31
  Administered 2024-01-31: 8 mL via INTRAVENOUS

## 2024-02-01 DIAGNOSIS — G35 Multiple sclerosis: Secondary | ICD-10-CM

## 2024-02-02 DIAGNOSIS — G35 Multiple sclerosis: Secondary | ICD-10-CM

## 2024-02-02 DIAGNOSIS — M47812 Spondylosis without myelopathy or radiculopathy, cervical region: Secondary | ICD-10-CM

## 2024-02-06 ENCOUNTER — Ambulatory Visit (INDEPENDENT_AMBULATORY_CARE_PROVIDER_SITE_OTHER): Payer: Self-pay | Admitting: Neurology

## 2024-12-18 ENCOUNTER — Other Ambulatory Visit (HOSPITAL_COMMUNITY): Payer: Self-pay

## 2024-12-18 ENCOUNTER — Encounter (HOSPITAL_COMMUNITY): Payer: Self-pay

## 2024-12-18 DIAGNOSIS — Z1231 Encounter for screening mammogram for malignant neoplasm of breast: Secondary | ICD-10-CM

## 2024-12-23 ENCOUNTER — Encounter (HOSPITAL_COMMUNITY): Payer: Self-pay

## 2024-12-26 ENCOUNTER — Encounter (HOSPITAL_COMMUNITY): Payer: Self-pay
